# Patient Record
Sex: Female | Born: 1995 | Race: Black or African American | Hispanic: No | Marital: Single | Smoking: Current every day smoker
Health system: Southern US, Community
[De-identification: ages and names within clinical notes are randomized; demographics above are authoritative.]

## PROBLEM LIST (undated history)

## (undated) DIAGNOSIS — N83209 Unspecified ovarian cyst, unspecified side: Secondary | ICD-10-CM

## (undated) DIAGNOSIS — N739 Female pelvic inflammatory disease, unspecified: Secondary | ICD-10-CM

## (undated) HISTORY — PX: LEFT OOPHORECTOMY: SHX1961

## (undated) HISTORY — PX: OVARIAN CYST REMOVAL: SHX89

---
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
## 2017-06-10 ENCOUNTER — Emergency Department: Payer: Medicaid Other

## 2017-06-10 ENCOUNTER — Emergency Department
Admission: EM | Admit: 2017-06-10 | Discharge: 2017-06-10 | Disposition: A | Discharge status: Home / Self Care | Payer: Medicaid Other | Attending: Emergency Medicine | Admitting: Emergency Medicine

## 2017-06-10 DIAGNOSIS — Z3046 Encounter for surveillance of implantable subdermal contraceptive: Secondary | ICD-10-CM | POA: Diagnosis not present

## 2017-06-10 DIAGNOSIS — F17210 Nicotine dependence, cigarettes, uncomplicated: Secondary | ICD-10-CM | POA: Insufficient documentation

## 2017-06-10 DIAGNOSIS — R1010 Upper abdominal pain, unspecified: Secondary | ICD-10-CM | POA: Diagnosis present

## 2017-06-10 DIAGNOSIS — N83201 Unspecified ovarian cyst, right side: Secondary | ICD-10-CM | POA: Diagnosis not present

## 2017-06-10 DIAGNOSIS — R110 Nausea: Secondary | ICD-10-CM | POA: Diagnosis not present

## 2017-06-10 LAB — URINALYSIS, COMPLETE (UACMP) WITH MICROSCOPIC
BACTERIA UA: NONE SEEN
BILIRUBIN URINE: NEGATIVE
GLUCOSE, UA: NEGATIVE mg/dL
HGB URINE DIPSTICK: NEGATIVE
Ketones, ur: NEGATIVE mg/dL
NITRITE: NEGATIVE
PROTEIN: NEGATIVE mg/dL
Specific Gravity, Urine: 1.023 (ref 1.005–1.030)
pH: 6 (ref 5.0–8.0)

## 2017-06-10 LAB — COMPREHENSIVE METABOLIC PANEL
ALK PHOS: 90 U/L (ref 38–126)
ALT: 11 U/L — ABNORMAL LOW (ref 14–54)
ANION GAP: 7 (ref 5–15)
AST: 20 U/L (ref 15–41)
Albumin: 3.9 g/dL (ref 3.5–5.0)
BUN: 7 mg/dL (ref 6–20)
CO2: 29 mmol/L (ref 22–32)
Calcium: 9.5 mg/dL (ref 8.9–10.3)
Chloride: 104 mmol/L (ref 101–111)
Creatinine, Ser: 0.62 mg/dL (ref 0.44–1.00)
Glucose, Bld: 109 mg/dL — ABNORMAL HIGH (ref 65–99)
POTASSIUM: 4.8 mmol/L (ref 3.5–5.1)
Sodium: 140 mmol/L (ref 135–145)
TOTAL PROTEIN: 8.3 g/dL — AB (ref 6.5–8.1)
Total Bilirubin: 0.9 mg/dL (ref 0.3–1.2)

## 2017-06-10 LAB — POCT PREGNANCY, URINE: PREG TEST UR: NEGATIVE

## 2017-06-10 LAB — CBC
HEMATOCRIT: 40.5 % (ref 35.0–47.0)
HEMOGLOBIN: 13.9 g/dL (ref 12.0–16.0)
MCH: 34 pg (ref 26.0–34.0)
MCHC: 34.4 g/dL (ref 32.0–36.0)
MCV: 99 fL (ref 80.0–100.0)
Platelets: 292 10*3/uL (ref 150–440)
RBC: 4.09 MIL/uL (ref 3.80–5.20)
RDW: 14.1 % (ref 11.5–14.5)
WBC: 8 10*3/uL (ref 3.6–11.0)

## 2017-06-10 LAB — CHLAMYDIA/NGC RT PCR (ARMC ONLY)
CHLAMYDIA TR: DETECTED — AB
N gonorrhoeae: NOT DETECTED

## 2017-06-10 LAB — LIPASE, BLOOD: Lipase: 17 U/L (ref 11–51)

## 2017-06-10 MED ORDER — IOPAMIDOL (ISOVUE-300) INJECTION 61%
75.0000 mL | Freq: Once | INTRAVENOUS | Status: AC | PRN
  Administered 2017-06-10: 75 mL via INTRAVENOUS
  Filled 2017-06-10: qty 75

## 2017-06-10 MED ORDER — KETOROLAC TROMETHAMINE 30 MG/ML IJ SOLN
15.0000 mg | Freq: Once | INTRAMUSCULAR | Status: AC
  Administered 2017-06-10: 15 mg via INTRAVENOUS
  Filled 2017-06-10: qty 1

## 2017-06-10 MED ORDER — OXYCODONE-ACETAMINOPHEN 5-325 MG PO TABS: 1.0000 | ORAL_TABLET | ORAL | 0 refills | Status: DC | PRN

## 2017-06-10 NOTE — ED Triage Notes (Signed)
Mid abdominal pain X 2 days. NVD. Intermittent sharp pain. Pt alert and oriented X4, active, cooperative, pt in NAD. RR even and unlabored, color WNL.

## 2017-06-10 NOTE — ED Notes (Signed)
Patient transported to Ultrasound 

## 2017-06-10 NOTE — Discharge Instructions (Signed)
Please make an appointment to follow-up with your OB gynecologist Dr. Georgianne Fick THIS WEEK for a recheck.  Return to the emergency department immediately for any new or worsening symptoms such as worsening pain, if she cannot eat or drink, or for any other concerns whatsoever.  It was a pleasure to take care of you today, and thank you for coming to our emergency department.  If you have any questions or concerns before leaving please ask the nurse to grab me and I'm more than happy to go through your aftercare instructions again.  If you were prescribed any opioid pain medication today such as Norco, Vicodin, Percocet, morphine, hydrocodone, or oxycodone please make sure you do not drive when you are taking this medication as it can alter your ability to drive safely.  If you have any concerns once you are home that you are not improving or are in fact getting worse before you can make it to your follow-up appointment, please do not hesitate to call [phone] and come back for further evaluation.  Darel Hong, MD  Results for orders placed or performed during the hospital encounter of 06/10/17  Lipase, blood  Result Value Ref Range   Lipase 17 11 - 51 U/L  Comprehensive metabolic panel  Result Value Ref Range   Sodium 140 135 - 145 mmol/L   Potassium 4.8 3.5 - 5.1 mmol/L   Chloride 104 101 - 111 mmol/L   CO2 29 22 - 32 mmol/L   Glucose, Bld 109 (H) 65 - 99 mg/dL   BUN 7 6 - 20 mg/dL   Creatinine, Ser 0.62 0.44 - 1.00 mg/dL   Calcium 9.5 8.9 - 10.3 mg/dL   Total Protein 8.3 (H) 6.5 - 8.1 g/dL   Albumin 3.9 3.5 - 5.0 g/dL   AST 20 15 - 41 U/L   ALT 11 (L) 14 - 54 U/L   Alkaline Phosphatase 90 38 - 126 U/L   Total Bilirubin 0.9 0.3 - 1.2 mg/dL   GFR calc non Af Amer >60 >60 mL/min   GFR calc Af Amer >60 >60 mL/min   Anion gap 7 5 - 15  CBC  Result Value Ref Range   WBC 8.0 3.6 - 11.0 K/uL   RBC 4.09 3.80 - 5.20 MIL/uL   Hemoglobin 13.9 12.0 - 16.0 g/dL   HCT 40.5 35.0 - 47.0 %   MCV  99.0 80.0 - 100.0 fL   MCH 34.0 26.0 - 34.0 pg   MCHC 34.4 32.0 - 36.0 g/dL   RDW 14.1 11.5 - 14.5 %   Platelets 292 150 - 440 K/uL  Urinalysis, Complete w Microscopic  Result Value Ref Range   Color, Urine YELLOW (A) YELLOW   APPearance CLEAR (A) CLEAR   Specific Gravity, Urine 1.023 1.005 - 1.030   pH 6.0 5.0 - 8.0   Glucose, UA NEGATIVE NEGATIVE mg/dL   Hgb urine dipstick NEGATIVE NEGATIVE   Bilirubin Urine NEGATIVE NEGATIVE   Ketones, ur NEGATIVE NEGATIVE mg/dL   Protein, ur NEGATIVE NEGATIVE mg/dL   Nitrite NEGATIVE NEGATIVE   Leukocytes, UA TRACE (A) NEGATIVE   RBC / HPF 0-5 0 - 5 RBC/hpf   WBC, UA 6-30 0 - 5 WBC/hpf   Bacteria, UA NONE SEEN NONE SEEN   Squamous Epithelial / LPF 6-30 (A) NONE SEEN   Mucus PRESENT   Pregnancy, urine POC  Result Value Ref Range   Preg Test, Ur NEGATIVE NEGATIVE   US Transvaginal Non-ob  Result Date: 06/10/2017  CLINICAL DATA:  Pelvic pain for 3 days. Previous history of left oophorectomy. EXAM: TRANSABDOMINAL AND TRANSVAGINAL ULTRASOUND OF PELVIS DOPPLER ULTRASOUND OF OVARIES TECHNIQUE: Both transabdominal and transvaginal ultrasound examinations of the pelvis were performed. Transabdominal technique was performed for global imaging of the pelvis including uterus, ovaries, adnexal regions, and pelvic cul-de-sac. It was necessary to proceed with endovaginal exam following the transabdominal exam to visualize the endometrium and right ovary. Color and duplex Doppler ultrasound was utilized to evaluate blood flow to the ovary. COMPARISON:  CT abdomen and pelvis 06/10/2017 and 02/09/2016. Ultrasound pelvis 02/09/2016. FINDINGS: Uterus Measurements: 7.8 x 3.9 x 5.1 cm. No fibroids or other mass visualized. Endometrium Thickness: 7 mm.  No focal abnormality visualized. Right ovary Measurements: 6 x 3.7 x 4. The uterus is mildly enlarged. There is a complex cyst with multiple septations and internal debris within the ovary. Correlating with CT 06/10/2017,  this likely represents a hemorrhagic cyst. Left ovary Left ovary surgically absent. No mass is demonstrated in the left adnexal region. Pulsed Doppler evaluation of the right ovary demonstrates normal low-resistance arterial and venous waveforms. Other findings Small amount of free fluid in the pelvis. IMPRESSION: The right ovary is enlarged and heterogeneous with complex cystic appearance. Changes are likely to represent a hemorrhagic cyst. Normal Doppler flow waveforms are obtained but mostly in the periphery of the ovary. Can't entirely exclude residua from previous intermittent torsion. Recommend follow-up ultrasound in 6-8 weeks or earlier if clinical symptoms warrant. These results were called by telephone at the time of interpretation on 06/10/2017 at 10:24 pm to Dr. Darel Hong , who verbally acknowledged these results. Electronically Signed   By: Lucienne Capers M.D.   On: 06/10/2017 22:55   US Pelvis Complete  Result Date: 06/10/2017 CLINICAL DATA:  Pelvic pain for 3 days. Previous history of left oophorectomy. EXAM: TRANSABDOMINAL AND TRANSVAGINAL ULTRASOUND OF PELVIS DOPPLER ULTRASOUND OF OVARIES TECHNIQUE: Both transabdominal and transvaginal ultrasound examinations of the pelvis were performed. Transabdominal technique was performed for global imaging of the pelvis including uterus, ovaries, adnexal regions, and pelvic cul-de-sac. It was necessary to proceed with endovaginal exam following the transabdominal exam to visualize the endometrium and right ovary. Color and duplex Doppler ultrasound was utilized to evaluate blood flow to the ovary. COMPARISON:  CT abdomen and pelvis 06/10/2017 and 02/09/2016. Ultrasound pelvis 02/09/2016. FINDINGS: Uterus Measurements: 7.8 x 3.9 x 5.1 cm. No fibroids or other mass visualized. Endometrium Thickness: 7 mm.  No focal abnormality visualized. Right ovary Measurements: 6 x 3.7 x 4. The uterus is mildly enlarged. There is a complex cyst with multiple  septations and internal debris within the ovary. Correlating with CT 06/10/2017, this likely represents a hemorrhagic cyst. Left ovary Left ovary surgically absent. No mass is demonstrated in the left adnexal region. Pulsed Doppler evaluation of the right ovary demonstrates normal low-resistance arterial and venous waveforms. Other findings Small amount of free fluid in the pelvis. IMPRESSION: The right ovary is enlarged and heterogeneous with complex cystic appearance. Changes are likely to represent a hemorrhagic cyst. Normal Doppler flow waveforms are obtained but mostly in the periphery of the ovary. Can't entirely exclude residua from previous intermittent torsion. Recommend follow-up ultrasound in 6-8 weeks or earlier if clinical symptoms warrant. These results were called by telephone at the time of interpretation on 06/10/2017 at 10:24 pm to Dr. Darel Hong , who verbally acknowledged these results. Electronically Signed   By: Lucienne Capers M.D.   On: 06/10/2017 22:55   Ct  Abdomen Pelvis W Contrast  Result Date: 06/10/2017 CLINICAL DATA:  Mid abdominal pain for 2 days. EXAM: CT ABDOMEN AND PELVIS WITH CONTRAST TECHNIQUE: Multidetector CT imaging of the abdomen and pelvis was performed using the standard protocol following bolus administration of intravenous contrast. CONTRAST:  74mL ISOVUE-300 IOPAMIDOL (ISOVUE-300) INJECTION 61% COMPARISON:  02/09/2016 FINDINGS: Lower chest:  Unremarkable Hepatobiliary: No focal abnormality within the liver parenchyma. There is no evidence for gallstones, gallbladder wall thickening, or pericholecystic fluid. No intrahepatic or extrahepatic biliary dilation. Pancreas: No focal mass lesion. No dilatation of the main duct. No intraparenchymal cyst. No peripancreatic edema. Spleen: No splenomegaly. No focal mass lesion. Adrenals/Urinary Tract: No adrenal nodule or mass. Kidneys are unremarkable. No evidence for hydroureter. The urinary bladder appears normal for the  degree of distention. Stomach/Bowel: Stomach is nondistended. No gastric wall thickening. No evidence of outlet obstruction. Duodenum is normally positioned as is the ligament of Treitz. No small bowel wall thickening. No small bowel dilatation. The terminal ileum is normal. The appendix is not visualized, but there is no edema or inflammation in the region of the cecum. No gross colonic mass. There may be some pericolonic edema/ inflammation in the region of the splenic flexure, but this region of the colon is not well distended. Vascular/Lymphatic: No abdominal aortic aneurysm. No abdominal aortic atherosclerotic calcification. There is no gastrohepatic or hepatoduodenal ligament lymphadenopathy. No intraperitoneal or retroperitoneal lymphadenopathy. No pelvic sidewall lymphadenopathy. Reproductive: Uterus unremarkable. No left adnexal mass. 3.6 x 6.1 x 4.1 cm complex cystic mass identified right adnexal space. Serpiginous, a tubular fluid-filled structure running along the medial aspect of this cystic lesion may be dilated fallopian tube, but lack of oral contrast on today's study hinders assessment and this may simply represent small bowel. Other: Small volume intraperitoneal free fluid identified in the cul-de-sac. Musculoskeletal: Bone windows reveal no worrisome lytic or sclerotic osseous lesions. IMPRESSION: 1. 4 x 6 x 4 cm complex cystic lesion right adnexal space with small volume free fluid in the posterior right adnexal region and cul-de-sac. This could be a complex ovarian cyst although ovarian torsion and TOA cannot be excluded. Serpiginous tubular structure running along the medial aspect of the lesion could represent a dilated fallopian tube, but given that no oral contrast was administered for this study, unopacified small bowel could also produce this appearance. Pelvic ultrasound may prove helpful to further evaluate. 2. Question mild pericolonic edema/inflammation around the splenic flexure, but  this area of colon is not well distended . Electronically Signed   By: Misty Stanley M.D.   On: 06/10/2017 21:01   [place] Art/ven Flow Abd Pelv Doppler  Result Date: 06/10/2017 CLINICAL DATA:  Pelvic pain for 3 days. Previous history of left oophorectomy. EXAM: TRANSABDOMINAL AND TRANSVAGINAL ULTRASOUND OF PELVIS DOPPLER ULTRASOUND OF OVARIES TECHNIQUE: Both transabdominal and transvaginal ultrasound examinations of the pelvis were performed. Transabdominal technique was performed for global imaging of the pelvis including uterus, ovaries, adnexal regions, and pelvic cul-de-sac. It was necessary to proceed with endovaginal exam following the transabdominal exam to visualize the endometrium and right ovary. Color and duplex Doppler ultrasound was utilized to evaluate blood flow to the ovary. COMPARISON:  CT abdomen and pelvis 06/10/2017 and 02/09/2016. Ultrasound pelvis 02/09/2016. FINDINGS: Uterus Measurements: 7.8 x 3.9 x 5.1 cm. No fibroids or other mass visualized. Endometrium Thickness: 7 mm.  No focal abnormality visualized. Right ovary Measurements: 6 x 3.7 x 4. The uterus is mildly enlarged. There is a complex cyst with multiple septations and  internal debris within the ovary. Correlating with CT 06/10/2017, this likely represents a hemorrhagic cyst. Left ovary Left ovary surgically absent. No mass is demonstrated in the left adnexal region. Pulsed Doppler evaluation of the right ovary demonstrates normal low-resistance arterial and venous waveforms. Other findings Small amount of free fluid in the pelvis. IMPRESSION: The right ovary is enlarged and heterogeneous with complex cystic appearance. Changes are likely to represent a hemorrhagic cyst. Normal Doppler flow waveforms are obtained but mostly in the periphery of the ovary. Can't entirely exclude residua from previous intermittent torsion. Recommend follow-up ultrasound in 6-8 weeks or earlier if clinical symptoms warrant. These results were called by  telephone at the time of interpretation on 06/10/2017 at 10:24 pm to Dr. Darel Hong , who verbally acknowledged these results. Electronically Signed   By: Lucienne Capers M.D.   On: 06/10/2017 22:55

## 2017-06-10 NOTE — ED Provider Notes (Signed)
[facility] Emergency Department Provider Note  ____________________________________________   First MD Initiated Contact with Patient 06/10/17 2011     (approximate)  I have reviewed the triage vital signs and the nursing notes.   HISTORY  Chief Complaint Abdominal Pain and Nausea    HPI Jamie Ryan is a 21 y.o. female who comes to the emergency department with 2 days of abdominal pain. Pain is mostly in her upper abdomen is intermittent lasting about a minute at a time and recurring every other minute or so. It is associated with nausea but no vomiting. No fevers or chills. No dysuria frequency or hesitancy. She has an abdominal surgical history of a left-sided oophorectomy for dermoid cyst.her pain is intermittent severe nothing seems to make it better or worse.   [phone]  Past Medical History:  Diagnosis Date  . Ovarian cyst   . PID (pelvic inflammatory disease) 01-2016    There are no active problems to display for this patient.   Past Surgical History:  Procedure Laterality Date  . LAPAROSCOPIC OVARIAN CYSTECTOMY Left 02/14/2016   Procedure: LAPAROSCOPY WITH Left Oophorectomy;  Surgeon: Malachy Mood, MD;  Location: [facility] ORS;  Service: Gynecology;  Laterality: Left;  . OVARIAN CYST REMOVAL      Prior to Admission medications   Medication Sig Start Date End Date Taking? Authorizing Provider  etonogestrel (IMPLANON) 68 MG IMPL implant 1 each by Subdermal route once.    [provider]  ibuprofen (ADVIL,MOTRIN) 600 MG tablet Take 1 tablet (600 mg total) by mouth every 6 (six) hours as needed. Patient not taking: Reported on 02/14/2016 02/09/16   Malachy Mood, MD  oxyCODONE-acetaminophen (PERCOCET/ROXICET) 5-325 MG tablet Take 1 tablet by mouth every 4 (four) hours as needed for moderate pain or severe pain. 06/10/17   Darel Hong, MD    Allergies Patient has no known allergies.  No family history on  file.  Social History Social History  Substance Use Topics  . Smoking status: Current Every Day Smoker    Packs/day: 0.25    Years: 1.00    Types: Cigarettes  . Smokeless tobacco: Not on file  . Alcohol use No    Review of Systems Constitutional: No fever/chills Eyes: No visual changes. ENT: No sore throat. Cardiovascular: Denies chest pain. Respiratory: Denies shortness of breath. Gastrointestinal: positive abdominal pain.  Positive nausea, no vomiting.  No diarrhea.  No constipation. Genitourinary: Negative for dysuria. Musculoskeletal: Negative for back pain. Skin: Negative for rash. Neurological: Negative for headaches, focal weakness or numbness.   ____________________________________________   PHYSICAL EXAM:  VITAL SIGNS: ED Triage Vitals  Enc Vitals Group     BP 06/10/17 1849 120/72     Pulse Rate 06/10/17 1849 (!) 57     Resp 06/10/17 1849 18     Temp 06/10/17 1849 98.1 F (36.7 C)     Temp Source 06/10/17 1849 Oral     SpO2 06/10/17 1849 100 %     Weight 06/10/17 1850 124 lb (56.2 kg)     Height 06/10/17 1850 5' (1.524 m)     Head Circumference --      Peak Flow --      Pain Score 06/10/17 1849 7     Pain Loc --      Pain Edu? --      Excl. in [place]? --     Constitutional: alert and oriented 4 appears somewhat uncomfortable nontoxic no diaphoresis speaks in full clear sentences Eyes:  PERRL EOMI. Head: Atraumatic. Nose: No congestion/rhinnorhea. Mouth/Throat: No trismus Neck: No stridor.   Cardiovascular: Normal rate, regular rhythm. Grossly normal heart sounds.  Good peripheral circulation. Respiratory: Normal respiratory effort.  No retractions. Lungs CTAB and moving good air Gastrointestinal: soft nondistended she is tender in her left greater than right lower quadrants negative Rovsing's no frank peritonitis negative Murphy's Musculoskeletal: No lower extremity edema   Neurologic:  Normal speech and language. No gross focal neurologic deficits  are appreciated. Skin:  Skin is warm, dry and intact. No rash noted. Psychiatric: Mood and affect are normal. Speech and behavior are normal.    ____________________________________________   DIFFERENTIAL includes but not limited to  appendicitis, diverticulitis, ovarian torsion, pyelonephritis, renal colic ____________________________________________   LABS (all labs ordered are listed, but only abnormal results are displayed)  Labs Reviewed  COMPREHENSIVE METABOLIC PANEL - Abnormal; Notable for the following:       Result Value   Glucose, Bld 109 (*)    Total Protein 8.3 (*)    ALT 11 (*)    All other components within normal limits  URINALYSIS, COMPLETE (UACMP) WITH MICROSCOPIC - Abnormal; Notable for the following:    Color, Urine YELLOW (*)    APPearance CLEAR (*)    Leukocytes, UA TRACE (*)    Squamous Epithelial / LPF 6-30 (*)    All other components within normal limits  CHLAMYDIA/NGC RT PCR ([facility] ONLY)  LIPASE, BLOOD  CBC  POC URINE PREG, ED  POCT PREGNANCY, URINE    not pregnant blood work unremarkable __________________________________________  EKG   ____________________________________________  RADIOLOGY  CT scan is complex but does show a 4 x 6 x 4 cm complex cyst in her right adnexal ____________________________________________   PROCEDURES  Procedure(s) performed: no  Procedures  Critical Care performed: no  Observation: no ____________________________________________   INITIAL IMPRESSION / ASSESSMENT AND PLAN / ED COURSE  Pertinent labs & imaging results that were available during my care of the patient were reviewed by me and considered in my medical decision making (see chart for details).  The patient arrives well-appearing and hemodynamically stable. She reports upper abdominal pain although she is quite tender in her left greater than right lower quadrants.     ----------------------------------------- 9:15 PM on  06/10/2017 -----------------------------------------  The patient's CT scan came back with a complex cystic mass in her right pelvis. Ovarian torsion is in the differential/ordered a pelvic ultrasound, however as the patient only has one ovary and desires children I have consulted [facility] (Dr. Georgianne Fick is her OB). ____________________________________________  ----------------------------------------- 9:19 PM on 06/10/2017 -----------------------------------------  I discussed the case with Dr. Kenton Kingfisher of [facility] gynecology who agrees with pelvic ultrasound. He said that as her pain is adequately controlled at this time she most likely is not currently torsed. Once we have the results of the ultrasound he is requesting a call back.  ----------------------------------------- 11:04 PM on 06/10/2017 -----------------------------------------  The patient's pelvic ultrasound is complex but appears to be most likely a hemorrhagic cyst. I discussed the case with Dr. Kenton Kingfisher who reviewed the films and as the patient is currently asymptomatic and her history is not entirely consistent with ovarian torsion we both feel the patient is medically stable for outpatient management with Dr. Georgianne Fick this week.  FINAL CLINICAL IMPRESSION(S) / ED DIAGNOSES  Final diagnoses:  Cyst of right ovary      NEW MEDICATIONS STARTED DURING THIS VISIT:  Current Discharge Medication List  Note:  This document was prepared using Dragon voice recognition software and may include unintentional dictation errors.     Darel Hong, MD 06/10/17 [phone]

## 2017-06-11 ENCOUNTER — Telehealth: Payer: Self-pay | Admitting: Emergency Medicine

## 2017-06-11 NOTE — Telephone Encounter (Addendum)
Called to give patient results of std testing.  Positive chlamydia test, and was not treated for in the ED.  Per Dr. Jimmye Norman can call in azithromycin 1 gram for patient.  Her telephone is not taking calls at this time.  Will call gyn followup doctor.  Called [facility].  They gave me another number to try to reach patient [phone].  Left message on that number.  Will send letter.  [facility] will get patient treated if she calls for appt. Before I get in touch with her.

## 2017-06-17 ENCOUNTER — Ambulatory Visit (INDEPENDENT_AMBULATORY_CARE_PROVIDER_SITE_OTHER): Payer: Medicaid Other | Admitting: Obstetrics and Gynecology

## 2017-06-17 ENCOUNTER — Encounter: Payer: Self-pay | Admitting: Obstetrics and Gynecology

## 2017-06-17 VITALS — BP 100/60 | HR 60 | Ht 60.0 in | Wt 131.0 lb

## 2017-06-17 DIAGNOSIS — N83201 Unspecified ovarian cyst, right side: Secondary | ICD-10-CM | POA: Diagnosis not present

## 2017-06-17 DIAGNOSIS — N7093 Salpingitis and oophoritis, unspecified: Secondary | ICD-10-CM

## 2017-06-17 DIAGNOSIS — R102 Pelvic and perineal pain: Secondary | ICD-10-CM | POA: Diagnosis not present

## 2017-06-17 DIAGNOSIS — N730 Acute parametritis and pelvic cellulitis: Secondary | ICD-10-CM

## 2017-06-17 MED ORDER — OXYCODONE-ACETAMINOPHEN 5-325 MG PO TABS: 1.0000 | ORAL_TABLET | Freq: Four times a day (QID) | ORAL | 0 refills | Status: DC | PRN

## 2017-06-17 MED ORDER — CEFTRIAXONE SODIUM 250 MG IJ SOLR
250.0000 mg | Freq: Once | INTRAMUSCULAR | Status: AC
  Administered 2017-06-17: 250 mg via INTRAMUSCULAR

## 2017-06-17 MED ORDER — DOXYCYCLINE HYCLATE 100 MG PO CAPS: 100.0000 mg | ORAL_CAPSULE | Freq: Two times a day (BID) | ORAL | 0 refills | Status: AC

## 2017-06-17 NOTE — Patient Instructions (Signed)
Ovarian Cyst °An ovarian cyst is a fluid-filled sac on an ovary. The ovaries are organs that make eggs in women. Most ovarian cysts go away on their own and are not cancerous (are benign). Some cysts need treatment. °Follow these instructions at home: °· Take over-the-counter and prescription medicines only as told by your doctor. °· Do not drive or use heavy machinery while taking prescription pain medicine. °· Get pelvic exams and Pap tests as often as told by your doctor. °· Return to your normal activities as told by your doctor. Ask your doctor what activities are safe for you. °· Do not use any products that contain nicotine or tobacco, such as cigarettes and e-cigarettes. If you need help quitting, ask your doctor. °· Keep all follow-up visits as told by your doctor. This is important. °Contact a doctor if: °· Your periods are: °¨ Late. °¨ Irregular. °¨ Painful. °· Your periods stop. °· You have pelvic pain that does not go away. °· You have pressure on your bladder. °· You have trouble making your bladder empty when you pee (urinate). °· You have pain during sex. °· You have any of the following in your belly (abdomen): °¨ A feeling of fullness. °¨ Pressure. °¨ Discomfort. °¨ Pain that does not go away. °¨ Swelling. °· You feel sick most of the time. °· You have trouble pooping (have constipation). °· You are not as hungry as usual (you lose your appetite). °· You get very bad acne. °· You start to have more hair on your body and face. °· You are gaining weight or losing weight without changing your exercise and eating habits. °· You think you may be pregnant. °Get help right away if: °· You have belly pain that is very bad or gets worse. °· You cannot eat or drink without throwing up (vomiting). °· You suddenly get a fever. °· Your period is a lot heavier than usual. °This information is not intended to replace advice given to you by your health care provider. Make sure you discuss any questions you have  with your health care provider. °Document Released: 03/05/2008 Document Revised: 04/06/2016 Document Reviewed: 02/19/2016 °Elsevier Interactive Patient Education © 2017 [employer] ° °

## 2017-06-17 NOTE — Progress Notes (Signed)
Obstetrics & Gynecology Office Visit   Chief Complaint:  Chief Complaint  Patient presents with  . Follow-up    stomach hurt/cramping; bleeding for 2nd time this month; no bc;     History of Present Illness: 21 year old GO with a history of left salpingo-oophorectomy for dermoid cyst seen in the emergency department for acute right lower quadrant pain.  Normal white count at that time with imaging showing a complex right adnexal process with a tubular component noted.  The patient was discharged to outpatient follow up.  She did not have a white count, was afebrile at the time of evaluation.  Symptoms have persisted, pain 5/10 confined to the right lower quadrant.  She has not noted an increased in vaginal discharge, has noted some irregular vaginal bleeding with repeat menses 2 weeks after last.  Pregnancy test in ED was negative.  She is not currently on any hormonal contraception, is in a monogamous relationship with one partner.   She did not have a wet mount done at the time of her visit.  Gynecology was consulted and she was dispositioned to outpatient follow up by Dr. Kenton Kingfisher.    Following discharge the patient's chlamydia did return positive, I have not been able to contact her  After the lab was routed to me.  Given the tubular structure and now positive chlamydia this likely represents to sequela of PID and TOA.  The patient is relatively asymptomatic on exam, non-toxic appearing, and has not had any nausea or emesis.     Review of Systems: 10 point review of systems negative unless otherwise noted in HPI  Past Medical History:  Past Medical History:  Diagnosis Date  . Ovarian cyst   . PID (pelvic inflammatory disease) 01-2016    Past Surgical History:  Past Surgical History:  Procedure Laterality Date  . LAPAROSCOPIC OVARIAN CYSTECTOMY Left 02/14/2016   Procedure: LAPAROSCOPY WITH Left Oophorectomy;  Surgeon: Malachy Mood, MD;  Location: [facility] ORS;  Service: Gynecology;   Laterality: Left;  . OVARIAN CYST REMOVAL      Gynecologic History: Patient's last menstrual period was 06/16/2017.  Obstetric History: G0P0000  Family History:  Family History  Problem Relation Age of Onset  . Diabetes Paternal Grandfather     Social History:  Social History   Social History  . Marital status: Single    Spouse name: N/A  . Number of children: N/A  . Years of education: N/A   Occupational History  . Not on file.   Social History Main Topics  . Smoking status: Current Every Day Smoker    Packs/day: 0.25    Years: 1.00    Types: Cigarettes  . Smokeless tobacco: Never Used  . Alcohol use No  . Drug use: No  . Sexual activity: Yes    Birth control/ protection: None   Other Topics Concern  . Not on file   Social History Narrative  . No narrative on file    Allergies:  No Known Allergies  Medications: Prior to Admission medications   Medication Sig Start Date End Date Taking? Authorizing Provider  oxyCODONE-acetaminophen (PERCOCET/ROXICET) 5-325 MG tablet Take 1 tablet by mouth every 4 (four) hours as needed for moderate pain or severe pain. 06/10/17  Yes Darel Hong, MD    Physical Exam Vitals:  Vitals:   06/17/17 0810  BP: 100/60  Pulse: 60   Patient's last menstrual period was 06/16/2017.  General: NAD HEENT: normocephalic, anicteric Thyroid: no enlargement,  no palpable nodules Pulmonary: No increased work of breathing Cardiovascular: RRR, distal pulses 2+ Abdomen: NABS, soft, non-tender, non-distended.  Umbilicus without lesions.  No hepatomegaly, splenomegaly or masses palpable. No evidence of hernia  Genitourinary:  External: Normal external female genitalia.  Normal urethral meatus, normal  Bartholin's and Skene's glands.    Vagina: Normal vaginal mucosa, no evidence of prolapse.    Cervix: Grossly normal in appearance, no bleeding  Uterus: Non-enlarged, mobile, normal contour.  No CMT  Adnexa: ovaries non-enlarged, no  adnexal masses  Rectal: deferred  Lymphatic: no evidence of inguinal lymphadenopathy Extremities: no edema, erythema, or tenderness Neurologic: Grossly intact Psychiatric: mood appropriate, affect full  Female chaperone present for pelvic and breast  portions of the physical exam  Assessment: 21 y.o. G0P0000 with PID, TOA  Plan: Problem List Items Addressed This Visit    None    Visit Diagnoses    Right ovarian cyst    -  Primary   Relevant Orders   US Transvaginal Non-OB   Pelvic pain       Relevant Orders   US Transvaginal Non-OB   PID (acute pelvic inflammatory disease)       Relevant Medications   cefTRIAXone (ROCEPHIN) injection 250 mg (Completed)   Tubo-ovarian abscess         - Given tubular compenent will treat as possible PID with ceftriaxone and 2 weeks course doxycyline - We discussed if fails to improve may require inpatient admission and IV antibiotics, in addition we discussed fertility implications with PID particularly in the setting of only on function fallopian tube - follow up US in 6 weeks - A total of 15 minutes were spent in face-to-face contact with the patient during this encounter with over half of that time devoted to counseling and coordination of care.

## 2017-06-20 ENCOUNTER — Telehealth: Payer: Self-pay | Admitting: Obstetrics and Gynecology

## 2017-06-20 NOTE — Telephone Encounter (Signed)
Called and left message with Patient family to have patient call back to schedule appt per AMS for US pelvis transvaginal non-ob and follow up with AMS by 07/29/17. Notifiy Izora Gala if paitent decides to not schedule appt. So we may cancel order to u/s.

## 2017-06-24 ENCOUNTER — Other Ambulatory Visit: Payer: Self-pay | Admitting: Obstetrics and Gynecology

## 2017-06-24 ENCOUNTER — Telehealth: Payer: Self-pay

## 2017-06-24 MED ORDER — ONDANSETRON 4 MG PO TBDP: 4.0000 mg | ORAL_TABLET | Freq: Four times a day (QID) | ORAL | 0 refills | Status: DC | PRN

## 2017-06-24 NOTE — Telephone Encounter (Signed)
Pt has question about med AMS rx'd   She took one pill and threw it up Saturday pm and did not take the second one - no antibx taken since.  What to do?  [phone]

## 2017-06-24 NOTE — Telephone Encounter (Signed)
Called in an antiemetic (zofran) to her pharmacy that she can take 30-60 minutes prior to the antibiotic.  If she continues to be intolerant of taking the medication by mouth she may need to go to the ER to be admitted for IV antibiotics

## 2017-06-24 NOTE — Telephone Encounter (Signed)
Please advise 

## 2017-06-25 NOTE — Telephone Encounter (Signed)
LVM for patient to call me back 

## 2017-06-25 NOTE — Telephone Encounter (Signed)
Pt aware of medication being called in 

## 2017-06-25 NOTE — Telephone Encounter (Signed)
Pt is calling back - please advise.  

## 2017-07-30 ENCOUNTER — Ambulatory Visit: Payer: Medicaid Other | Admitting: Obstetrics and Gynecology

## 2017-07-30 ENCOUNTER — Other Ambulatory Visit: Payer: Medicaid Other

## 2018-12-17 ENCOUNTER — Ambulatory Visit: Payer: Medicaid Other | Admitting: Obstetrics & Gynecology

## 2019-02-09 ENCOUNTER — Ambulatory Visit: Payer: Medicaid Other | Admitting: Maternal Newborn

## 2019-02-13 IMAGING — CT CT ABD-PELV W/ CM
2 of 4 series · 15 of 46 positions shown, 17 images · IV contrast (iopamidol)
Comparison: 02/09/2016

CLINICAL DATA: Mid abdominal pain for 2 days.

EXAM:
CT ABDOMEN AND PELVIS WITH CONTRAST
TECHNIQUE: Multidetector CT imaging of the abdomen and pelvis was performed
using the standard protocol following bolus administration of
intravenous contrast.
CONTRAST:  75mL OHFVHD-OLL IOPAMIDOL (OHFVHD-OLL) INJECTION 61%

[Series 2: routine abd/pel with · axial · 0.60mm/px · z∈[-416,-86]mm · 12 of 74 slices shown, 14 images]
[im 4/74  soft-tissue]
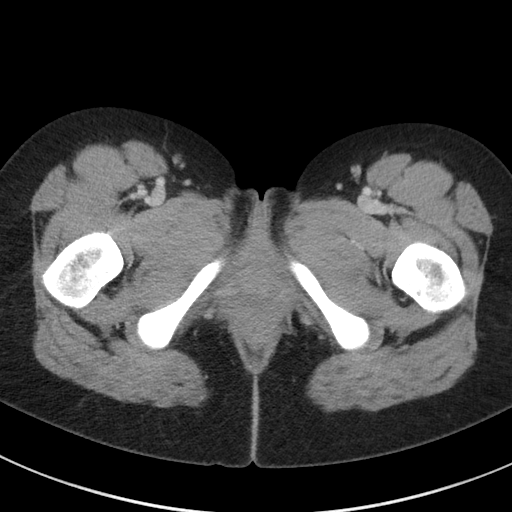
[im 4/74  bone]
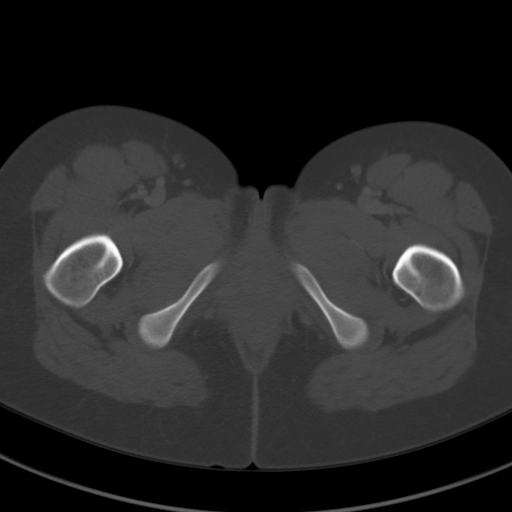
[im 10/74  soft-tissue]
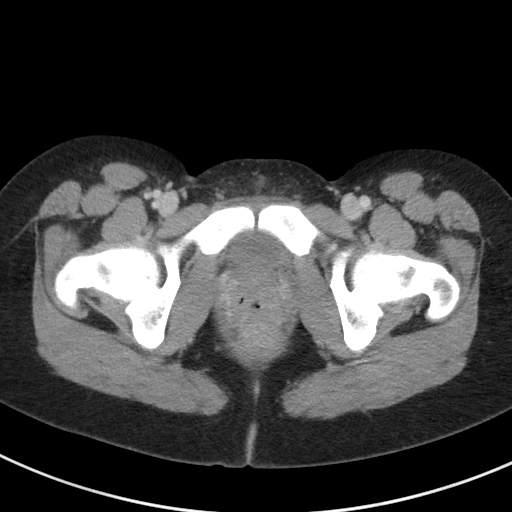
[im 16/74  soft-tissue]
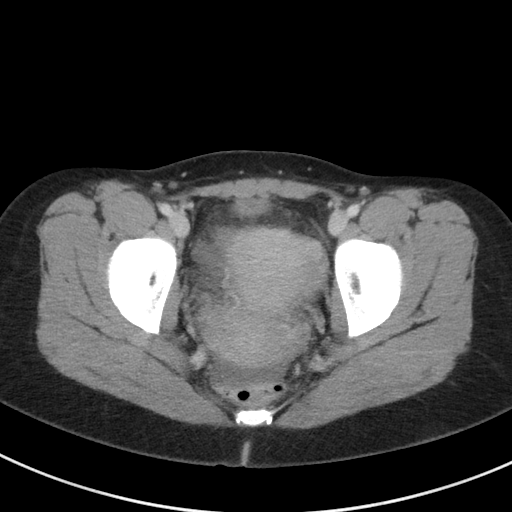
[im 22/74  soft-tissue]
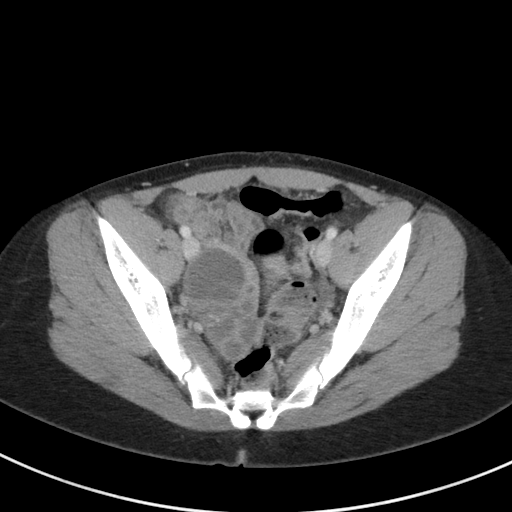
[im 28/74  soft-tissue]
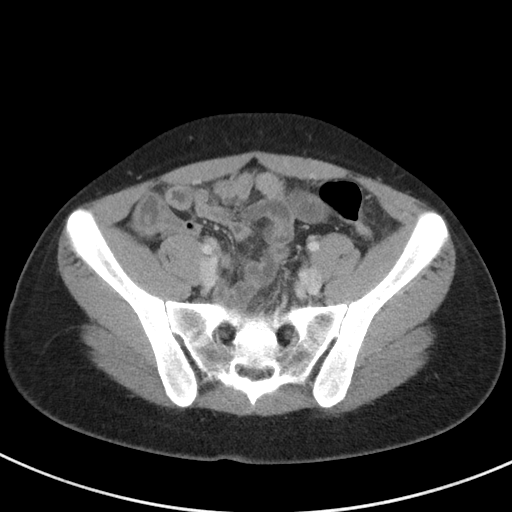
[im 34/74  soft-tissue]
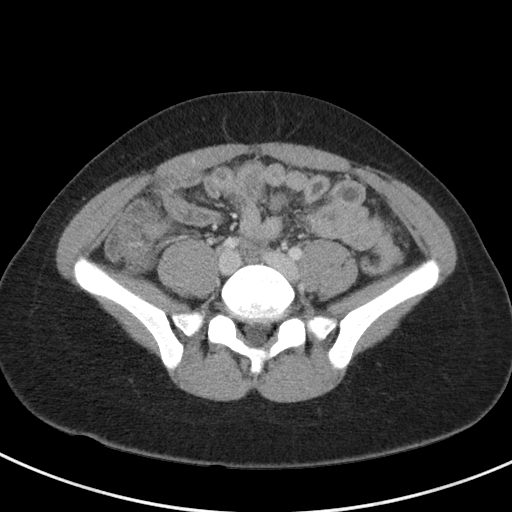
[im 40/74  soft-tissue]
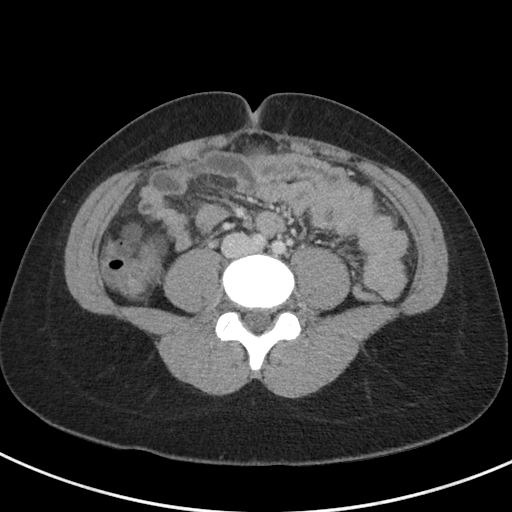
[im 46/74  soft-tissue]
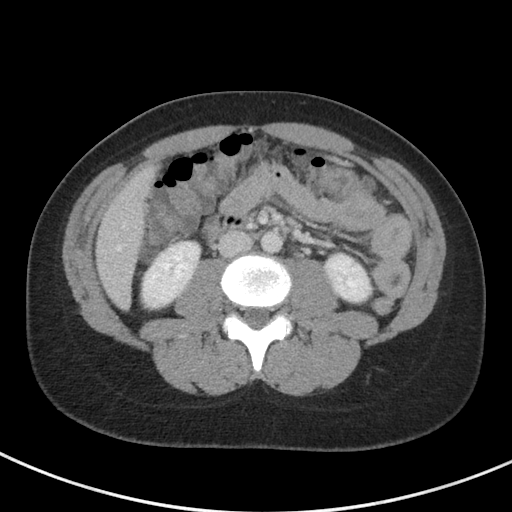
[im 52/74  soft-tissue]
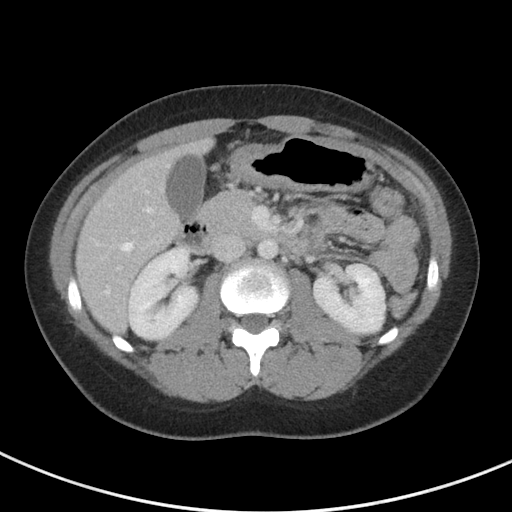
[im 52/74  bone]
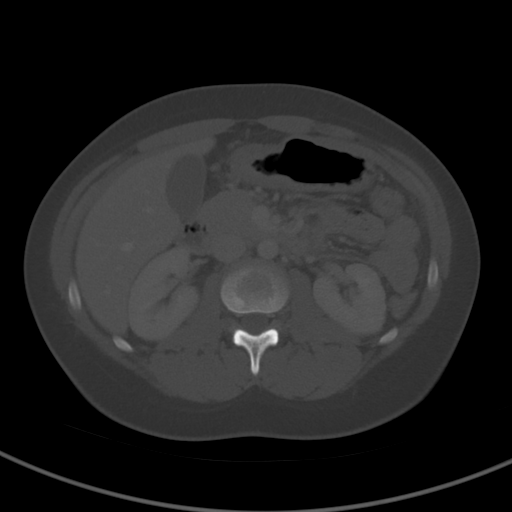
[im 58/74  soft-tissue]
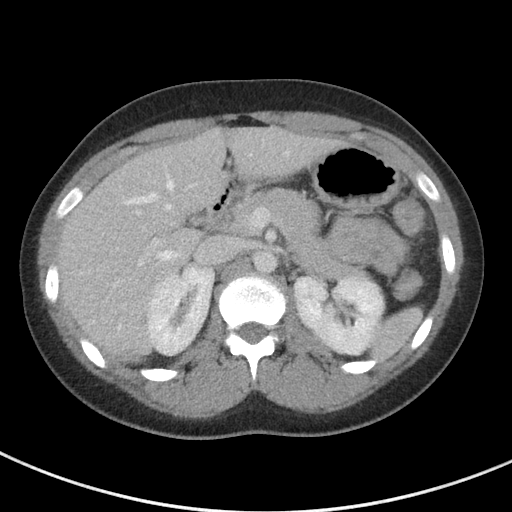
[im 64/74  soft-tissue]
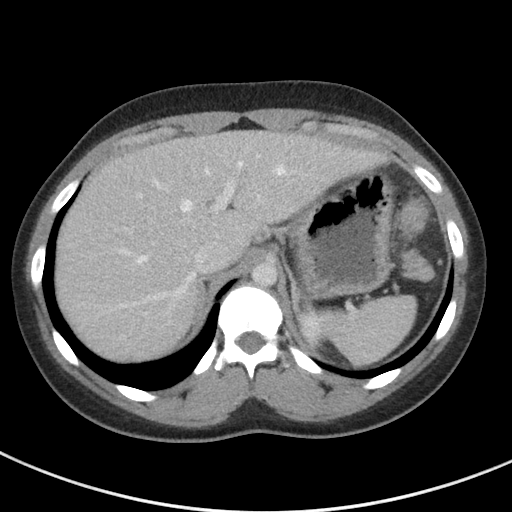
[im 70/74  soft-tissue]
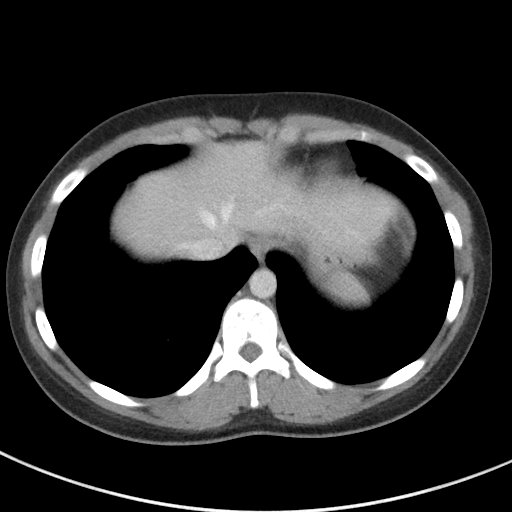

[Series 5: coronal st · coronal · 0.62mm/px · 3 of 75 slices shown]
[im 25/75  soft-tissue]
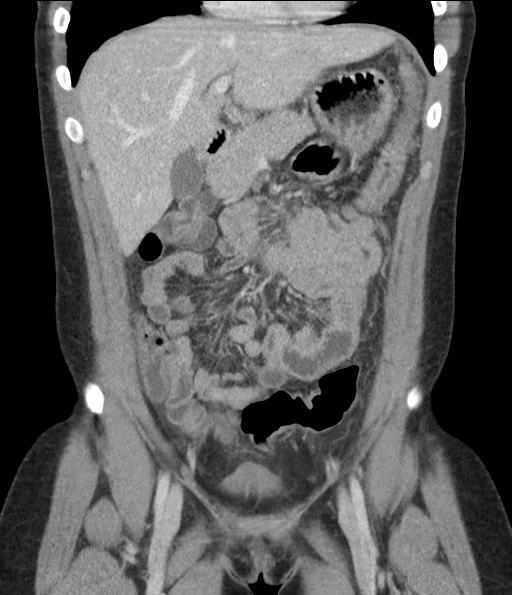
[im 33/75  soft-tissue]
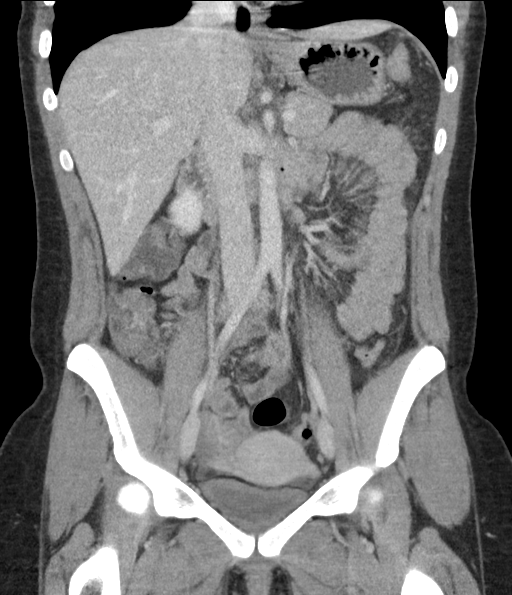
[im 42/75  soft-tissue]
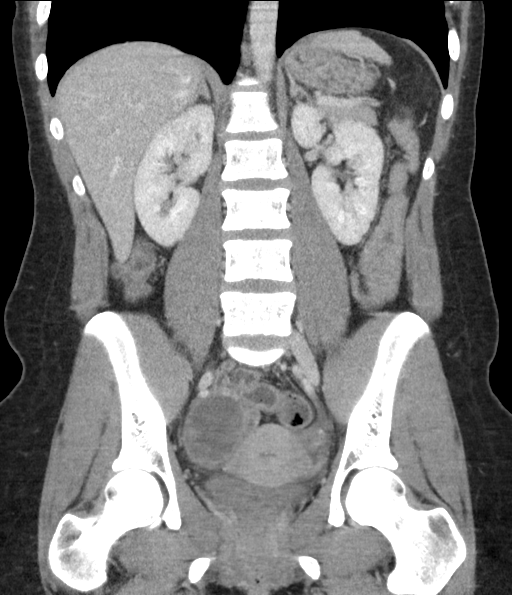

[15 of 46 positions shown; findings below may reference images not displayed]

FINDINGS: Lower chest:  Unremarkable

Hepatobiliary: No focal abnormality within the liver parenchyma.
There is no evidence for gallstones, gallbladder wall thickening, or
pericholecystic fluid. No intrahepatic or extrahepatic biliary
dilation.

Pancreas: No focal mass lesion. No dilatation of the main duct. No
intraparenchymal cyst. No peripancreatic edema.

Spleen: No splenomegaly. No focal mass lesion.

Adrenals/Urinary Tract: No adrenal nodule or mass. Kidneys are
unremarkable. No evidence for hydroureter. The urinary bladder
appears normal for the degree of distention.

Stomach/Bowel: Stomach is nondistended. No gastric wall thickening.
No evidence of outlet obstruction. Duodenum is normally positioned
as is the ligament of Treitz. No small bowel wall thickening. No
small bowel dilatation. The terminal ileum is normal. The appendix
is not visualized, but there is no edema or inflammation in the
region of the cecum. No gross colonic mass. There may be some
pericolonic edema/ inflammation in the region of the splenic
flexure, but this region of the colon is not well distended.

Vascular/Lymphatic: No abdominal aortic aneurysm. No abdominal
aortic atherosclerotic calcification. There is no gastrohepatic or
hepatoduodenal ligament lymphadenopathy. No intraperitoneal or
retroperitoneal lymphadenopathy. No pelvic sidewall lymphadenopathy.

Reproductive: Uterus unremarkable. No left adnexal mass. 3.6 x 6.1 x
4.1 cm complex cystic mass identified right adnexal space.
Serpiginous, a tubular fluid-filled structure running along the
medial aspect of this cystic lesion may be dilated fallopian tube,
but lack of oral contrast on today's study hinders assessment and
this may simply represent small bowel.

Other: Small volume intraperitoneal free fluid identified in the
cul-de-sac.

Musculoskeletal: Bone windows reveal no worrisome lytic or sclerotic
osseous lesions.
IMPRESSION: 1. 4 x 6 x 4 cm complex cystic lesion right adnexal space with small
volume free fluid in the posterior right adnexal region and
cul-de-sac. This could be a complex ovarian cyst although ovarian
torsion and TOA cannot be excluded. Serpiginous tubular structure
running along the medial aspect of the lesion could represent a
dilated fallopian tube, but given that no oral contrast was
administered for this study, unopacified small bowel could also
produce this appearance. Pelvic ultrasound may prove helpful to
further evaluate.
2. Question mild pericolonic edema/inflammation around the splenic
flexure, but this area of colon is not well distended .

## 2019-10-16 ENCOUNTER — Encounter: Payer: Self-pay | Admitting: Obstetrics and Gynecology

## 2019-10-16 ENCOUNTER — Other Ambulatory Visit (HOSPITAL_COMMUNITY)
Admission: RE | Admit: 2019-10-16 | Discharge: 2019-10-16 | Disposition: A | Discharge status: Home / Self Care | Payer: Medicaid Other | Source: Ambulatory Visit | Attending: Obstetrics and Gynecology | Admitting: Obstetrics and Gynecology

## 2019-10-16 ENCOUNTER — Ambulatory Visit (INDEPENDENT_AMBULATORY_CARE_PROVIDER_SITE_OTHER): Payer: Medicaid Other | Admitting: Obstetrics and Gynecology

## 2019-10-16 ENCOUNTER — Other Ambulatory Visit: Payer: Self-pay

## 2019-10-16 VITALS — BP 120/70 | Ht 60.0 in | Wt 133.0 lb

## 2019-10-16 DIAGNOSIS — Z113 Encounter for screening for infections with a predominantly sexual mode of transmission: Secondary | ICD-10-CM

## 2019-10-16 DIAGNOSIS — Z124 Encounter for screening for malignant neoplasm of cervix: Secondary | ICD-10-CM | POA: Diagnosis present

## 2019-10-16 DIAGNOSIS — Z319 Encounter for procreative management, unspecified: Secondary | ICD-10-CM

## 2019-10-16 DIAGNOSIS — N9489 Other specified conditions associated with female genital organs and menstrual cycle: Secondary | ICD-10-CM

## 2019-10-16 DIAGNOSIS — Z0000 Encounter for general adult medical examination without abnormal findings: Secondary | ICD-10-CM | POA: Diagnosis not present

## 2019-10-16 DIAGNOSIS — Z1329 Encounter for screening for other suspected endocrine disorder: Secondary | ICD-10-CM

## 2019-10-16 DIAGNOSIS — N926 Irregular menstruation, unspecified: Secondary | ICD-10-CM

## 2019-10-16 DIAGNOSIS — Z130 Encounter for screening for diseases of the blood and blood-forming organs and certain disorders involving the immune mechanism: Secondary | ICD-10-CM

## 2019-10-16 NOTE — Patient Instructions (Signed)
Steps to Quit Smoking Smoking tobacco is the leading cause of preventable death. It can affect almost every organ in the body. Smoking puts you and those around you at risk for developing many serious chronic diseases. Quitting smoking can be difficult, but it is one of the best things that you can do for your health. It is never too late to quit. How do I get ready to quit? When you decide to quit smoking, create a plan to help you succeed. Before you quit:  Pick a date to quit. Set a date within the next 2 weeks to give you time to prepare.  Write down the reasons why you are quitting. Keep this list in places where you will see it often.  Tell your family, friends, and co-workers that you are quitting. Support from your loved ones can make quitting easier.  Talk with your health care provider about your options for quitting smoking.  Find out what treatment options are covered by your health insurance.  Identify people, places, things, and activities that make you want to smoke (triggers). Avoid them. What first steps can I take to quit smoking?  Throw away all cigarettes at home, at work, and in your car.  Throw away smoking accessories, such as ashtrays and lighters.  Clean your car. Make sure to empty the ashtray.  Clean your home, including curtains and carpets. What strategies can I use to quit smoking? Talk with your health care provider about combining strategies, such as taking medicines while you are also receiving in-person counseling. Using these two strategies together makes you more likely to succeed in quitting than if you used either strategy on its own.  If you are pregnant or breastfeeding, talk with your health care provider about finding counseling or other support strategies to quit smoking. Do not take medicine to help you quit smoking unless your health care provider tells you to do so. To quit smoking: Quit right away  Quit smoking completely, instead of  gradually reducing how much you smoke over a period of time. Research shows that stopping smoking right away is more successful than gradually quitting.  Attend in-person counseling to help you build problem-solving skills. You are more likely to succeed in quitting if you attend counseling sessions regularly. Even short sessions of 10 minutes can be effective. Take medicine You may take medicines to help you quit smoking. Some medicines require a prescription and some you can purchase over-the-counter. Medicines may have nicotine in them to replace the nicotine in cigarettes. Medicines may:  Help to stop cravings.  Help to relieve withdrawal symptoms. Your health care provider may recommend:  Nicotine patches, gum, or lozenges.  Nicotine inhalers or sprays.  Non-nicotine medicine that is taken by mouth. Find resources Find resources and support systems that can help you to quit smoking and remain smoke-free after you quit. These resources are most helpful when you use them often. They include:  Online chats with a counselor.  Telephone quitlines.  Printed self-help materials.  Support groups or group counseling.  Text messaging programs.  Mobile phone apps or applications. Use apps that can help you stick to your quit plan by providing reminders, tips, and encouragement. There are many free apps for mobile devices as well as websites. Examples include Quit Guide from the CDC and smokefree.gov What things can I do to make it easier to quit?   Reach out to your family and friends for support and encouragement. Call telephone quitlines (1-800-QUIT-NOW), reach   out to support groups, or work with a Social worker for support.  Ask people who smoke to avoid smoking around you.  Avoid places that trigger you to smoke, such as bars, parties, or smoke-break areas at work.  Spend time with people who do not smoke.  Lessen the stress in your life. Stress can be a smoking trigger for some  people. To lessen stress, try: ? Exercising regularly. ? Doing deep-breathing exercises. ? Doing yoga. ? Meditating. ? Performing a body scan. This involves closing your eyes, scanning your body from head to toe, and noticing which parts of your body are particularly tense. Try to relax the muscles in those areas. How will I feel when I quit smoking? Day 1 to 3 weeks Within the first 24 hours of quitting smoking, you may start to feel withdrawal symptoms. These symptoms are usually most noticeable 2-3 days after quitting, but they usually do not last for more than 2-3 weeks. You may experience these symptoms:  Mood swings.  Restlessness, anxiety, or irritability.  Trouble concentrating.  Dizziness.  Strong cravings for sugary foods and nicotine.  Mild weight gain.  Constipation.  Nausea.  Coughing or a sore throat.  Changes in how the medicines that you take for unrelated issues work in your body.  Depression.  Trouble sleeping (insomnia). Week 3 and afterward After the first 2-3 weeks of quitting, you may start to notice more positive results, such as:  Improved sense of smell and taste.  Decreased coughing and sore throat.  Slower heart rate.  Lower blood pressure.  Clearer skin.  The ability to breathe more easily.  Fewer sick days. Quitting smoking can be very challenging. Do not get discouraged if you are not successful the first time. Some people need to make many attempts to quit before they achieve long-term success. Do your best to stick to your quit plan, and talk with your health care provider if you have any questions or concerns. Summary  Smoking tobacco is the leading cause of preventable death. Quitting smoking is one of the best things that you can do for your health.  When you decide to quit smoking, create a plan to help you succeed.  Quit smoking right away, not slowly over a period of time.  When you start quitting, seek help from your  health care provider, family, or friends. This information is not intended to replace advice given to you by your health care provider. Make sure you discuss any questions you have with your health care provider. Document Revised: 06/12/2019 Document Reviewed: 12/06/2018 Elsevier Patient Education  [place].   Pregnancy and Smoking Smoking during pregnancy is unhealthy for you and your baby. Smoke from cigarettes, e-cigarettes, pipes, and cigars contains many chemicals that can cause cancer (carcinogens). These products also contain a stimulant drug (nicotine). When you smoke, harmful substances that you breathe in enter your bloodstream and can be passed on to your baby. This can affect your baby's development. If you are planning to become pregnant or have recently become pregnant, talk with your health care provider about quitting smoking. You have a much better chance of having a healthy pregnancy and a healthy baby if you do not smoke while you are pregnant. How does smoking affect me? Smoking increases your risk for many long-term (chronic) diseases. These diseases include cancer, lung diseases, and heart disease. Smoking during pregnancy increases your risk of:  Losing the pregnancy (miscarriage or stillbirth).  Giving birth too early (premature birth).  Pregnancy outside of the uterus (tubal pregnancy).  Problems with the placenta, which is the organ that provides the baby nourishment and oxygen. These problems may include: ? Attachment of the placenta over the opening of the uterus (placenta previa). ? Detachment of the placenta before the baby's birth (placental abruption).  Having your water break before labor begins. How does smoking affect my baby? Before birth Smoking during pregnancy:  Decreases blood flow and oxygen to your baby.  Increases your baby's risk of birth defects, such as heart defects.  Increases your baby's heart rate.  Slows your baby's growth  in the uterus (intrauterine growth retardation). After birth Babies born to women who smoked during pregnancy may:  Have symptoms of nicotine withdrawal.  Be born with a cleft lip, cleft palate, or other facial deformities.  Be too small at birth.  Have a high risk of: ? Serious health problems or lifelong disabilities. These may result in the long-term need for certain medicines, therapies, or other treatments. ? Sudden infant death syndrome (SIDS). Follow these instructions at home:   Do not use any products that contain nicotine or tobacco, such as cigarettes, e-cigarettes, and chewing tobacco. If you need help quitting, ask your health care provider.  Talk with your health care provider about support strategies to quit smoking. Some methods to consider include: ? Counseling (smoking cessation counseling). ? Psychotherapy. ? Acupuncture. ? Hypnosis. ? Telephone hotlines for people trying to quit.  Do not take nicotine supplements or medicine to help you quit smoking unless your health care provider tells you to do so.  Avoid secondhand smoke. Ask people who smoke to avoid smoking around you.  Identify people, places, things, and activities that make you want to smoke (triggers). Avoid them. Where to find more information Learn more about smoking during pregnancy and quitting smoking from:  March of Dimes: www.marchofdimes.org  [place].S. Department of Health and Human Services: women.smokefree.gov  American Cancer Society: www.cancer.org  American Heart Association: www.heart.[place]: www.cancer.gov For help to quit smoking:  National smoking cessation telephone hotline: 1-800-QUIT NOW (602)436-2100) Contact a health care provider if:  You are struggling to quit smoking.  You are a smoker and you become pregnant or plan to become pregnant.  You start smoking again after giving birth. Summary  Smoking during pregnancy is unhealthy for you and your  baby.  Tobacco smoke contains harmful substances that can affect a baby's health and development.  Smoking increases the risk for serious problems, such as miscarriage, birth defects, or premature birth.  If you need help to quit smoking, talk to your health care provider and ask about support strategies such as counseling. This information is not intended to replace advice given to you by your health care provider. Make sure you discuss any questions you have with your health care provider. Document Revised: 04/17/2019 Document Reviewed: 04/17/2019 Elsevier Patient Education  [place], Adult After being diagnosed with an anxiety disorder, you may be relieved to know why you have felt or behaved a certain way. You may also feel overwhelmed about the treatment ahead and what it will mean for your life. With care and support, you can manage this condition and recover from it. How to manage lifestyle changes Managing stress and anxiety  Stress is your body's reaction to life changes and events, both good and bad. Most stress will last just a few hours, but stress can be ongoing and can lead to  more than just stress. Although stress can play a major role in anxiety, it is not the same as anxiety. Stress is usually caused by something external, such as a deadline, test, or competition. Stress normally passes after the triggering event has ended.  Anxiety is caused by something internal, such as imagining a terrible outcome or worrying that something will go wrong that will devastate you. Anxiety often does not go away even after the triggering event is over, and it can become long-term (chronic) worry. It is important to understand the differences between stress and anxiety and to manage your stress effectively so that it does not lead to an anxious response. Talk with your health care provider or a counselor to learn more about reducing anxiety and stress. He or she may  suggest tension reduction techniques, such as:  Music therapy. This can include creating or listening to music that you enjoy and that inspires you.  Mindfulness-based meditation. This involves being aware of your normal breaths while not trying to control your breathing. It can be done while sitting or walking.  Centering prayer. This involves focusing on a word, phrase, or sacred image that means something to you and brings you peace.  Deep breathing. To do this, expand your stomach and inhale slowly through your nose. Hold your breath for 3-5 seconds. Then exhale slowly, letting your stomach muscles relax.  Self-talk. This involves identifying thought patterns that lead to anxiety reactions and changing those patterns.  Muscle relaxation. This involves tensing muscles and then relaxing them. Choose a tension reduction technique that suits your lifestyle and personality. These techniques take time and practice. Set aside 5-15 minutes a day to do them. Therapists can offer counseling and training in these techniques. The training to help with anxiety may be covered by some insurance plans. Other things you can do to manage stress and anxiety include:  Keeping a stress/anxiety diary. This can help you learn what triggers your reaction and then learn ways to manage your response.  Thinking about how you react to certain situations. You may not be able to control everything, but you can control your response.  Making time for activities that help you relax and not feeling guilty about spending your time in this way.  Visual imagery and yoga can help you stay calm and relax.  Medicines Medicines can help ease symptoms. Medicines for anxiety include:  Anti-anxiety drugs.  Antidepressants. Medicines are often used as a primary treatment for anxiety disorder. Medicines will be prescribed by a health care provider. When used together, medicines, psychotherapy, and tension reduction techniques  may be the most effective treatment. Relationships Relationships can play a big part in helping you recover. Try to spend more time connecting with trusted friends and family members. Consider going to couples counseling, taking family education classes, or going to family therapy. Therapy can help you and others better understand your condition. How to recognize changes in your anxiety Everyone responds differently to treatment for anxiety. Recovery from anxiety happens when symptoms decrease and stop interfering with your daily activities at home or work. This may mean that you will start to:  Have better concentration and focus. Worry will interfere less in your daily thinking.  Sleep better.  Be less irritable.  Have more energy.  Have improved memory. It is important to recognize when your condition is getting worse. Contact your health care provider if your symptoms interfere with home or work and you feel like your condition is not  improving. Follow these instructions at home: Activity  Exercise. Most adults should do the following: ? Exercise for at least 150 minutes each week. The exercise should increase your heart rate and make you sweat (moderate-intensity exercise). ? Strengthening exercises at least twice a week.  Get the right amount and quality of sleep. Most adults need 7-9 hours of sleep each night. Lifestyle   Eat a healthy diet that includes plenty of vegetables, fruits, whole grains, low-fat dairy products, and lean protein. Do not eat a lot of foods that are high in solid fats, added sugars, or salt.  Make choices that simplify your life.  Do not use any products that contain nicotine or tobacco, such as cigarettes, e-cigarettes, and chewing tobacco. If you need help quitting, ask your health care provider.  Avoid caffeine, alcohol, and certain over-the-counter cold medicines. These may make you feel worse. Ask your pharmacist which medicines to avoid. General  instructions  Take over-the-counter and prescription medicines only as told by your health care provider.  Keep all follow-up visits as told by your health care provider. This is important. Where to find support You can get help and support from these sources:  Self-help groups.  Online and [employer].  A trusted spiritual leader.  Couples counseling.  Family education classes.  Family therapy. Where to find more information You may find that joining a support group helps you deal with your anxiety. The following sources can help you locate counselors or support groups near you:  [place]: www.mentalhealthamerica.net  Anxiety and Depression Association of [place] (ADAA): https://www.[place].net/  National Alliance on Mental Illness (NAMI): www.nami.org Contact a health care provider if you:  Have a hard time staying focused or finishing daily tasks.  Spend many hours a day feeling worried about everyday life.  Become exhausted by worry.  Start to have headaches, feel tense, or have nausea.  Urinate more than normal.  Have diarrhea. Get help right away if you have:  A racing heart and shortness of breath.  Thoughts of hurting yourself or others. If you ever feel like you may hurt yourself or others, or have thoughts about taking your own life, get help right away. You can go to your nearest emergency department or call:  Your local emergency services (911 in the [place]  A suicide crisis helpline, such as the [facility] at (919)781-1790. This is open 24 hours a day. Summary  Taking steps to learn and use tension reduction techniques can help calm you and help prevent triggering an anxiety reaction.  When used together, medicines, psychotherapy, and tension reduction techniques may be the most effective treatment.  Family, friends, and partners can play a big part in helping you recover from an anxiety disorder. This  information is not intended to replace advice given to you by your health care provider. Make sure you discuss any questions you have with your health care provider. Document Revised: 02/17/2019 Document Reviewed: 02/17/2019 Elsevier Patient Education  [place].

## 2019-10-16 NOTE — Progress Notes (Signed)
Gynecology Annual Exam  PCP: Patient, No Pcp Per  Chief Complaint:  Chief Complaint  Patient presents with  . Gynecologic Exam    Would like to conceive     History of Present Illness: Patient is a 24 y.o. G1P0010 presents for annual exam. The patient has no complaints today.   LMP: Patient's last menstrual period was 10/15/2019 (exact date). Menarche:16 Average Interval: regular, 28 days Duration of flow: 4 days Heavy Menses: yes.  She reports she changing a super tampon every hour, no passage of large clots. Clots: no Intermenstrual Bleeding: no Postcoital Bleeding: no Dysmenorrhea: mild cramps  The patient is sexually active. She currently uses none for contraception. She admits to dyspareunia.  There is no notable family history of breast or ovarian cancer in her family.  The patient has regular exercise: walks 10 minutes to andfrom work each day.    The patient denies current symptoms of depression.  She reports that she feels she is an anxious person. Desires to avoid pharmaceutical management at this time.   GAD7:5 PHQ9:6   Hardin Negus Denzer reports that she has been trying the for about 1 year.  She had a Nexplanon removed 1 year ago.  She reports regular intercourse around the time that she ovulates.  She has some pain with intercourse which is positional.  Pain is new in last couple of months. Pain occurs with deep penetration.  She had chlamydia twice.  She has a history of tubo-ovarian abscess.  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  She currently smokes 4 cigarettes a day.  She has cut back from 1 pack a day.  She has smoked for 2 to 3 years.  She feels like she knows she needs to stop.  She reports that she uses alcohol.  She has a 1-2 shots every 2 weeks.  She is not currently taking a prenatal vitamin.   Review of Systems: ROS  Past Medical History:  Past Medical History:  Diagnosis Date  . Ovarian cyst   . PID (pelvic  inflammatory disease) 01-2016    Past Surgical History:  Past Surgical History:  Procedure Laterality Date  . LAPAROSCOPIC OVARIAN CYSTECTOMY Left 02/14/2016   Procedure: LAPAROSCOPY WITH Left Oophorectomy;  Surgeon: Malachy Mood, MD;  Location: [facility] ORS;  Service: Gynecology;  Laterality: Left;  . OVARIAN CYST REMOVAL      Gynecologic History:  Patient's last menstrual period was 10/15/2019 (exact date). Contraception: none Last Pap: Results were: Never  Obstetric History: G1P0010  Family History:  Family History  Problem Relation Age of Onset  . Diabetes Paternal Grandfather     Social History:  Social History   Socioeconomic History  . Marital status: Single    Spouse name: Not on file  . Number of children: Not on file  . Years of education: Not on file  . Highest education level: Not on file  Occupational History  . Not on file  Tobacco Use  . Smoking status: Current Every Day Smoker    Packs/day: 0.25    Years: 1.00    Pack years: 0.25    Types: Cigarettes  . Smokeless tobacco: Never Used  Substance and Sexual Activity  . Alcohol use: No  . Drug use: No  . Sexual activity: Yes    Birth control/protection: None  Other Topics Concern  . Not on file  Social History Narrative  . Not on file   Social Determinants of  Health   Financial Resource Strain:   . Difficulty of Paying Living Expenses: Not on file  Food Insecurity:   . Worried About Charity fundraiser in the Last Year: Not on file  . Ran Out of Food in the Last Year: Not on file  Transportation Needs:   . Lack of Transportation (Medical): Not on file  . Lack of Transportation (Non-Medical): Not on file  Physical Activity:   . Days of Exercise per Week: Not on file  . Minutes of Exercise per Session: Not on file  Stress:   . Feeling of Stress : Not on file  Social Connections:   . Frequency of Communication with Friends and Family: Not on file  . Frequency of Social Gatherings with  Friends and Family: Not on file  . Attends Religious Services: Not on file  . Active Member of Clubs or Organizations: Not on file  . Attends Archivist Meetings: Not on file  . Marital Status: Not on file  Intimate Partner Violence:   . Fear of Current or Ex-Partner: Not on file  . Emotionally Abused: Not on file  . Physically Abused: Not on file  . Sexually Abused: Not on file    Allergies:  No Known Allergies  Medications: Prior to Admission medications   Not on File    Physical Exam Vitals: Blood pressure 120/70, height 5' (1.524 m), weight 133 lb (60.3 kg), last menstrual period 10/15/2019.  General: NAD HEENT: normocephalic, anicteric Thyroid: no enlargement, no palpable nodules Pulmonary: No increased work of breathing, CTAB Cardiovascular: RRR, distal pulses 2+ Breast: Breast symmetrical, no tenderness, no palpable nodules or masses, no skin or nipple retraction present, no nipple discharge.  No axillary or supraclavicular lymphadenopathy. Abdomen: NABS, soft, non-tender, non-distended.  Umbilicus without lesions.  No hepatomegaly, splenomegaly or masses palpable. No evidence of hernia  Genitourinary:  External: Normal external female genitalia.  Normal urethral meatus, normal Bartholin's and Skene's glands.    Vagina: Normal vaginal mucosa, no evidence of prolapse.    Cervix: Grossly normal in appearance, no bleeding  Uterus: Non-enlarged, mobile, normal contour.  No CMT  Adnexa: ovaries non-enlarged, no adnexal masses  Rectal: deferred  Lymphatic: no evidence of inguinal lymphadenopathy Extremities: no edema, erythema, or tenderness Neurologic: Grossly intact Psychiatric: mood appropriate, affect full  Female chaperone present for pelvic and breast  portions of the physical exam    Assessment: 24 y.o. G1P0010 routine annual exam  Plan: Problem List Items Addressed This Visit    None    Visit Diagnoses    Healthcare maintenance    -  Primary    Relevant Orders   CBC   Thyroid disorder screen       Relevant Orders   TSH + free T4   Infertility management       Relevant Orders   Hysterosalpingogram (HSG) for infertility   Anti mullerian hormone   Menstrual abnormality       Relevant Orders   TSH + free T4   Prolactin   FSH   Screening, anemia, deficiency, iron       Relevant Orders   CBC   Screening examination for STD (sexually transmitted disease)       Relevant Orders   Cytology - PAP   Screening for cervical cancer       Relevant Orders   Cytology - PAP   Adnexal mass       Relevant Orders   [place] GYN Transvaginal  1)  Gardasil Series discussed and if applicable offered to patient - Patient has previously completed 3 shot series   2) STI screening  was offered and accepted  3)  ASCCP guidelines and rational discussed.  Patient opts for every 3 years screening interval  4) Contraception - the patient is currently using  none.  She is attempting to conceive in the near future We discussed safe sex practices to reduce her furture risk of STI's.    5) Infertility- discussed basic evaluation. Ordered labs and sent message about scheduling HSG. Will plan follow up to discuss further and order semen analysis. Will Need pelvic US for right sided adnexal mass. Discussed smoking cessation. Advised to start PNV. Discussed timing of intercourse for conception.  6) Counseled regarding smoking cessation.  7) Counseled regarding anxiety management.   8) Return in about 4 weeks (around 11/13/2019) for GYN visit and [place].  Adrian Prows MD [facility], [facility] 10/16/2019 2:48 PM

## 2019-10-20 LAB — CYTOLOGY - PAP
Chlamydia: NEGATIVE
Comment: NEGATIVE
Comment: NEGATIVE
Comment: NORMAL
Diagnosis: NEGATIVE
Neisseria Gonorrhea: NEGATIVE
Trichomonas: POSITIVE — AB

## 2019-10-21 LAB — CBC
Hematocrit: 38.2 % (ref 34.0–46.6)
Hemoglobin: 12.2 g/dL (ref 11.1–15.9)
MCH: 31.9 pg (ref 26.6–33.0)
MCHC: 31.9 g/dL (ref 31.5–35.7)
MCV: 100 fL — ABNORMAL HIGH (ref 79–97)
Platelets: 329 10*3/uL (ref 150–450)
RBC: 3.82 x10E6/uL (ref 3.77–5.28)
RDW: 13.4 % (ref 11.7–15.4)
WBC: 5 10*3/uL (ref 3.4–10.8)

## 2019-10-21 LAB — FOLLICLE STIMULATING HORMONE: FSH: 7.5 m[IU]/mL

## 2019-10-21 LAB — PROLACTIN: Prolactin: 15.3 ng/mL (ref 4.8–23.3)

## 2019-10-21 LAB — TSH+FREE T4
Free T4: 1.18 ng/dL (ref 0.82–1.77)
TSH: 1.36 u[IU]/mL (ref 0.450–4.500)

## 2019-10-21 LAB — ANTI MULLERIAN HORMONE: ANTI-MULLERIAN HORMONE (AMH): 1.82 ng/mL

## 2019-11-12 ENCOUNTER — Ambulatory Visit: Payer: Medicaid Other | Attending: Obstetrics and Gynecology

## 2019-11-13 ENCOUNTER — Ambulatory Visit: Payer: Medicaid Other | Admitting: Obstetrics and Gynecology

## 2019-11-13 ENCOUNTER — Other Ambulatory Visit: Payer: Medicaid Other

## 2020-06-03 ENCOUNTER — Ambulatory Visit: Payer: Medicaid Other

## 2020-06-16 ENCOUNTER — Ambulatory Visit: Payer: Self-pay

## 2020-06-16 ENCOUNTER — Encounter: Payer: Self-pay | Admitting: Physician Assistant

## 2020-06-16 ENCOUNTER — Other Ambulatory Visit: Payer: Self-pay

## 2020-06-16 ENCOUNTER — Ambulatory Visit: Payer: Self-pay | Admitting: Physician Assistant

## 2020-06-16 DIAGNOSIS — Z113 Encounter for screening for infections with a predominantly sexual mode of transmission: Secondary | ICD-10-CM

## 2020-06-16 LAB — WET PREP FOR TRICH, YEAST, CLUE
Trichomonas Exam: NEGATIVE
Yeast Exam: NEGATIVE

## 2020-06-16 NOTE — Progress Notes (Signed)
[facility] Department STI clinic/screening visit  Subjective:  Jamie Ryan is a 24 y.o. female being seen today for an STI screening visit. The patient reports they do not have symptoms.  Patient reports that they do not desire a pregnancy in the next year.   They reported they are not interested in discussing contraception today.  Patient's last menstrual period was 06/13/2020.   Patient has the following medical conditions:  There are no problems to display for this patient.   Chief Complaint  Patient presents with  . SEXUALLY TRANSMITTED DISEASE    screening    HPI  Patient reports that she is not having any symptoms but her partner is having symptoms so she would like a screening.  Denies chronic conditions and regular medicines.  Reports last HIV test was in 2020 and last pap 08/2019.  Using condoms as her BCM.    See flowsheet for further details and programmatic requirements.    The following portions of the patient's history were reviewed and updated as appropriate: allergies, current medications, past medical history, past social history, past surgical history and problem list.  Objective:  There were no vitals filed for this visit.  Physical Exam Constitutional:      General: She is not in acute distress.    Appearance: Normal appearance.  HENT:     Head: Normocephalic and atraumatic.     Comments: No nits, lice, or hair loss. No cervical, supraclavicular or axillary adenopathy.    Mouth/Throat:     Mouth: Mucous membranes are moist.     Pharynx: Oropharynx is clear. No oropharyngeal exudate or posterior oropharyngeal erythema.  Eyes:     Conjunctiva/sclera: Conjunctivae normal.  Pulmonary:     Effort: Pulmonary effort is normal.  Abdominal:     Palpations: Abdomen is soft. There is no mass.     Tenderness: There is no abdominal tenderness. There is no guarding or rebound.  Genitourinary:    General: Normal vulva.     Rectum: Normal.      Comments: External genitalia/pubic area without nits, lice, edema, erythema, lesions and inguinal adenopathy. Vagina with normal mucosa and discharge. Cervix without visible lesions. Uterus firm, mobile, nt, no masses,no CMT, no adnexal tenderness or fullness. Musculoskeletal:     Cervical back: Neck supple. No tenderness.  Skin:    General: Skin is warm and dry.     Findings: No bruising, erythema, lesion or rash.  Neurological:     Mental Status: She is alert and oriented to person, place, and time.  Psychiatric:        Mood and Affect: Mood normal.        Behavior: Behavior normal.        Thought Content: Thought content normal.        Judgment: Judgment normal.      Assessment and Plan:  Jamie Ryan is a 24 y.o. female presenting to the [facility] Department for STI screening  1. Screening for STD (sexually transmitted disease) Patient into clinic without symptoms. Counseled patient that if she finds out that her partner tests positive for an infection before her results are back that she can RTC for a treatment appointment. Rec condoms with all sex. Await test results.  Counseled that RN will call if needs to RTC for treatment once results are back. - WET PREP FOR TRICH, YEAST, CLUE - Gonococcus culture - Chlamydia/Gonorrhea [place] [facility] - HIV/HCV [place] [facility] - Syphilis Serology, [facility] [facility]  Return for PRN.  No future appointments.  Jerene Dilling, PA

## 2020-06-16 NOTE — Progress Notes (Signed)
Wet mount reviewed and no treatment needed for wet mount per standing order and per provider verbal order as pt is not having any symptoms. Provider orders completed.

## 2020-06-17 ENCOUNTER — Encounter: Payer: Self-pay | Admitting: Obstetrics and Gynecology

## 2020-06-21 LAB — GONOCOCCUS CULTURE

## 2020-06-23 LAB — HM HIV SCREENING LAB: HM HIV Screening: NEGATIVE

## 2020-06-23 LAB — HM HEPATITIS C SCREENING LAB: HM Hepatitis Screen: NEGATIVE

## 2020-06-24 ENCOUNTER — Telehealth: Payer: Self-pay | Admitting: Family Medicine

## 2020-06-24 ENCOUNTER — Encounter: Payer: Self-pay | Admitting: Family Medicine

## 2020-06-24 NOTE — Telephone Encounter (Signed)
Call to patient to inform of positive gonorrhea & chlamydia  infection.  No answer, left message on machine or unable to [facility].  Marland Kitchenmecred

## 2020-06-27 ENCOUNTER — Ambulatory Visit: Payer: Self-pay | Admitting: Physician Assistant

## 2020-06-27 ENCOUNTER — Other Ambulatory Visit: Payer: Self-pay

## 2020-06-27 ENCOUNTER — Telehealth: Payer: Self-pay | Admitting: Family Medicine

## 2020-06-27 DIAGNOSIS — A5602 Chlamydial vulvovaginitis: Secondary | ICD-10-CM

## 2020-06-27 DIAGNOSIS — A5402 Gonococcal vulvovaginitis, unspecified: Secondary | ICD-10-CM

## 2020-06-27 DIAGNOSIS — Z113 Encounter for screening for infections with a predominantly sexual mode of transmission: Secondary | ICD-10-CM

## 2020-06-27 MED ORDER — AZITHROMYCIN 500 MG PO TABS
1000.0000 mg | ORAL_TABLET | Freq: Once | ORAL | Status: AC
  Administered 2020-06-27: 12:00:00 1000 mg via ORAL

## 2020-06-27 MED ORDER — CEFTRIAXONE SODIUM 500 MG IJ SOLR
500.0000 mg | Freq: Once | INTRAMUSCULAR | Status: AC
  Administered 2020-06-27: 12:00:00 500 mg via INTRAMUSCULAR

## 2020-06-27 NOTE — Telephone Encounter (Signed)
Patient returned call.  Verified by password.  Patient informed of positive GC/CT infection.  Appointment scheduled.  Junious Dresser, RN

## 2020-06-27 NOTE — Progress Notes (Signed)
S:  Patient into clinic for treatment today.  States that she has not started having any symptoms since she was here for her screening visit on 06/16/2020.  Reports that she is having some abdominal cramping since it is about time for her period to start.  NKDA. O:  WDWN female in NAD, A&O x 3, normal work of breathing;  GC and Chlamydia from 03/16/2020 visit are both positive. A/P:  1.  Patient needs treatment for GC and Chlamydia. 2.  Reviewed with patient SE of medicines and when to call clinic if vomits < 2 hr after taking medicine. 3.  No sex for 7 days and until after partner completes treatment. 4.  Treat with Ceftriaxone 500 mg IM and Azithromycin 1 g po DOT today. 5.  Rec condoms with all sex. 6.  RTC for re-screening in 3 months and prn.

## 2020-06-27 NOTE — Progress Notes (Signed)
Pt treated for Gonorrhea and Chlamydia per Antoine Primas, PA order. Pt tolerated well. Counseled pt per provider orders and pt states understanding. Provider orders completed.

## 2020-08-30 ENCOUNTER — Encounter (HOSPITAL_COMMUNITY): Payer: Self-pay | Admitting: Emergency Medicine

## 2020-08-30 ENCOUNTER — Emergency Department (HOSPITAL_COMMUNITY)
Admission: EM | Admit: 2020-08-30 | Discharge: 2020-08-30 | Disposition: A | Discharge status: Home / Self Care | Payer: Medicaid Other | Attending: Emergency Medicine | Admitting: Emergency Medicine

## 2020-08-30 ENCOUNTER — Other Ambulatory Visit: Payer: Self-pay

## 2020-08-30 DIAGNOSIS — R1030 Lower abdominal pain, unspecified: Secondary | ICD-10-CM | POA: Insufficient documentation

## 2020-08-30 DIAGNOSIS — T192XXA Foreign body in vulva and vagina, initial encounter: Secondary | ICD-10-CM | POA: Insufficient documentation

## 2020-08-30 DIAGNOSIS — Z5321 Procedure and treatment not carried out due to patient leaving prior to being seen by health care provider: Secondary | ICD-10-CM | POA: Insufficient documentation

## 2020-08-30 DIAGNOSIS — X58XXXA Exposure to other specified factors, initial encounter: Secondary | ICD-10-CM | POA: Insufficient documentation

## 2020-08-30 LAB — COMPREHENSIVE METABOLIC PANEL
ALT: 17 U/L (ref 0–44)
AST: 22 U/L (ref 15–41)
Albumin: 3.5 g/dL (ref 3.5–5.0)
Alkaline Phosphatase: 70 U/L (ref 38–126)
Anion gap: 13 (ref 5–15)
BUN: 6 mg/dL (ref 6–20)
CO2: 24 mmol/L (ref 22–32)
Calcium: 9.2 mg/dL (ref 8.9–10.3)
Chloride: 100 mmol/L (ref 98–111)
Creatinine, Ser: 0.71 mg/dL (ref 0.44–1.00)
GFR, Estimated: >60 mL/min (ref >60)
Glucose, Bld: 153 mg/dL — ABNORMAL HIGH (ref 70–99)
Potassium: 3.5 mmol/L (ref 3.5–5.1)
Sodium: 137 mmol/L (ref 135–145)
Total Bilirubin: 1 mg/dL (ref 0.3–1.2)
Total Protein: 6.6 g/dL (ref 6.5–8.1)

## 2020-08-30 LAB — CBC
HCT: 43.9 % (ref 36.0–46.0)
Hemoglobin: 14.5 g/dL (ref 12.0–15.0)
MCH: 34.4 pg — ABNORMAL HIGH (ref 26.0–34.0)
MCHC: 33 g/dL (ref 30.0–36.0)
MCV: 104 fL — ABNORMAL HIGH (ref 80.0–100.0)
Platelets: 270 10*3/uL (ref 150–400)
RBC: 4.22 MIL/uL (ref 3.87–5.11)
RDW: 13.7 % (ref 11.5–15.5)
WBC: 9.4 10*3/uL (ref 4.0–10.5)
nRBC: 0 % (ref 0.0–0.2)

## 2020-08-30 LAB — URINALYSIS, ROUTINE W REFLEX MICROSCOPIC
Bilirubin Urine: NEGATIVE
Glucose, UA: NEGATIVE mg/dL
Hgb urine dipstick: NEGATIVE
Ketones, ur: NEGATIVE mg/dL
Leukocytes,Ua: NEGATIVE
Nitrite: NEGATIVE
Protein, ur: NEGATIVE mg/dL
Specific Gravity, Urine: 1.021 (ref 1.005–1.030)
pH: 5 (ref 5.0–8.0)

## 2020-08-30 LAB — LIPASE, BLOOD: Lipase: 21 U/L (ref 11–51)

## 2020-08-30 LAB — I-STAT BETA HCG BLOOD, ED (MC, WL, AP ONLY): I-stat hCG, quantitative: <5 m[IU]/mL (ref <5)

## 2020-08-30 NOTE — ED Notes (Signed)
Pt told tech she was able to get the tampon out and she felt better so she was going to go home

## 2020-08-30 NOTE — ED Triage Notes (Signed)
Pt st's she has a tampon stuck in her vagina xs 2 days and cant get it out Pt c/o lower abd pain

## 2021-03-07 ENCOUNTER — Other Ambulatory Visit: Payer: Self-pay

## 2021-03-07 ENCOUNTER — Ambulatory Visit: Payer: Self-pay | Admitting: Physician Assistant

## 2021-03-07 DIAGNOSIS — Z202 Contact with and (suspected) exposure to infections with a predominantly sexual mode of transmission: Secondary | ICD-10-CM

## 2021-03-07 DIAGNOSIS — Z113 Encounter for screening for infections with a predominantly sexual mode of transmission: Secondary | ICD-10-CM

## 2021-03-07 DIAGNOSIS — A5901 Trichomonal vulvovaginitis: Secondary | ICD-10-CM

## 2021-03-07 LAB — WET PREP FOR TRICH, YEAST, CLUE
Trichomonas Exam: POSITIVE — AB
Yeast Exam: NEGATIVE

## 2021-03-07 MED ORDER — AZITHROMYCIN 500 MG PO TABS
1000.0000 mg | ORAL_TABLET | Freq: Once | ORAL | Status: AC
  Administered 2021-03-07: 1000 mg via ORAL

## 2021-03-07 MED ORDER — CEFTRIAXONE SODIUM 500 MG IJ SOLR
500.0000 mg | Freq: Once | INTRAMUSCULAR | Status: AC
Start: 2021-03-07 — End: 2021-03-07
  Administered 2021-03-07: 500 mg via INTRAMUSCULAR

## 2021-03-07 MED ORDER — METRONIDAZOLE 500 MG PO TABS: 500.0000 mg | ORAL_TABLET | Freq: Two times a day (BID) | ORAL | 0 refills | Status: AC

## 2021-03-08 ENCOUNTER — Encounter: Payer: Self-pay | Admitting: Physician Assistant

## 2021-03-08 NOTE — Progress Notes (Signed)
[facility] Department STI clinic/screening visit  Subjective:  Jamie Ryan is a 25 y.o. female being seen today for an STI screening visit. The patient reports they do have symptoms.  Patient reports that they do not desire a pregnancy in the next year.   They reported they are not interested in discussing contraception today.  No LMP recorded.   Patient has the following medical conditions:  There are no problems to display for this patient.   Chief Complaint  Patient presents with  . SEXUALLY TRANSMITTED DISEASE    screening    HPI  Patient reports that she is a contact to [place] and Chlamydia.  States that she has had cloudy discharge and cramping for a few days.  Denies chronic conditions and regular medicines.  States last HIV test was earlier in 2022 and is not sure when she last had a pap.   LMP 03/01/21 and uses condoms as her BCM.                See flowsheet for further details and programmatic requirements.    The following portions of the patient's history were reviewed and updated as appropriate: allergies, current medications, past medical history, past social history, past surgical history and problem list.  Objective:  There were no vitals filed for this visit.  Physical Exam Constitutional:      General: She is not in acute distress.    Appearance: Normal appearance.  HENT:     Head: Normocephalic and atraumatic.     Comments: No nits,lice, or hair loss. No cervical, supraclavicular or axillary adenopathy.    Mouth/Throat:     Mouth: Mucous membranes are moist.     Pharynx: Oropharynx is clear. No oropharyngeal exudate or posterior oropharyngeal erythema.  Eyes:     Conjunctiva/sclera: Conjunctivae normal.  Pulmonary:     Effort: Pulmonary effort is normal.  Abdominal:     Palpations: Abdomen is soft. There is no mass.     Tenderness: There is no abdominal tenderness. There is no guarding or rebound.  Genitourinary:    General: Normal  vulva.     Rectum: Normal.     Comments: External genitalia/pubic area without nits, lice, edema, erythema, lesions and inguinal adenopathy. Vagina with normal mucosa and small amount of pinkish bloody discharge. Cervix without visible lesions. Uterus firm, mobile, nt, no masses, no CMT, no adnexal tenderness or fullness. Musculoskeletal:     Cervical back: Neck supple. No tenderness.  Skin:    General: Skin is warm and dry.     Findings: No bruising, erythema, lesion or rash.  Neurological:     Mental Status: She is alert and oriented to person, place, and time.  Psychiatric:        Mood and Affect: Mood normal.        Behavior: Behavior normal.        Thought Content: Thought content normal.        Judgment: Judgment normal.      Assessment and Plan:  Jamie Ryan is a 25 y.o. female presenting to the [facility] Department for STI screening  1. Screening for STD (sexually transmitted disease) Patient into clinic without symptoms. Rec condoms with all sex. Await test results.  Counseled that RN will call if needs to RTC for treatment once results are back. - WET PREP FOR TRICH, YEAST, CLUE - Gonococcus culture - Chlamydia/Gonorrhea [place] [facility] - HIV [place] [facility] - Syphilis Serology, [facility]  2. Gonorrhea contact Will treat as a contact to [facility] and cover for Chlamydia with Ceftriaxone 500 mg IM and Azithromycin 1 g po DOT today. No sex for 14 days and until after partner completes treatment. Enc to use OTC antifungal cream if has itching during or just after antibiotic use. - cefTRIAXone (ROCEPHIN) injection 500 mg - azithromycin (ZITHROMAX) tablet 1,000 mg  3. Trichomonal vulvovaginitis Will treat for Trich with Metronidazole 500 mg #14 1 po BID for 7 days with food, no EtOH for 24 hr before and until 72 hr after completing medicine. No sex for 14 days and until after partner completes treatment. - metroNIDAZOLE (FLAGYL) 500 MG tablet; Take 1  tablet (500 mg total) by mouth 2 (two) times daily for 7 days.  Dispense: 14 tablet; Refill: 0     No follow-ups on file.  No future appointments.  Jerene Dilling, PA

## 2021-03-09 LAB — HM HIV SCREENING LAB: HM HIV Screening: NEGATIVE

## 2021-03-11 LAB — GONOCOCCUS CULTURE

## 2021-03-11 NOTE — Progress Notes (Signed)
Chart reviewed by Pharmacist  Suzanne Walker PharmD, Contract Pharmacist at [facility]  

## 2021-03-22 ENCOUNTER — Other Ambulatory Visit: Payer: Self-pay

## 2021-03-22 ENCOUNTER — Ambulatory Visit: Payer: Self-pay | Admitting: Family Medicine

## 2021-03-22 ENCOUNTER — Ambulatory Visit: Payer: Self-pay

## 2021-03-22 DIAGNOSIS — Z708 Other sex counseling: Secondary | ICD-10-CM

## 2021-03-22 DIAGNOSIS — Z113 Encounter for screening for infections with a predominantly sexual mode of transmission: Secondary | ICD-10-CM

## 2021-03-22 LAB — WET PREP FOR TRICH, YEAST, CLUE
Trichomonas Exam: NEGATIVE
Yeast Exam: NEGATIVE

## 2021-03-22 NOTE — Progress Notes (Signed)
Pt here to have a repeat test for Trich.  She has completed her treatment and wanted to make sure that it "it has gone away."  Wet mount results reviewed, no treatment required.  Condoms given. Windle Guard, RN

## 2021-03-22 NOTE — Progress Notes (Signed)
S: patient in clinic for TOC for Trich  O: positive Trich on 03/07/21 A: patient treated at appointment with Metronidazole  mg PO BID x 7 days.  Patient waited 7 days before sex and did not engage with untreated partner.  Patient denies signs/ symptoms. P: patient to  self-collect wet prep       Wet prep negative.  No treatment needed.  Condoms recommended for all sex for STI protection.   Patient declined condoms.      Junious Dresser, FNP

## 2021-03-22 NOTE — Progress Notes (Signed)
In Nurse Clinic at 2:45 pm. [facility] visit listed as Results. Pt explains that she has her test results from 03/07/2021 which were neg. RN confirmed her HIV, Gonorrhea, chlamydia, and syphilis tests were negative. Pt states she wants to be re screened for trich today and is not sure she made that clear when scheduling appt. States she completed the metronidazole as prescribed on 03/07/2021 without problem and denies symptoms. Consult Hilario Quarry, FNP who schedules pt to be seen this pm at 3 pm for STI visit. RN explained to pt and she was sent to clerk for STI appt check in. Josie Saunders, RN

## 2021-03-26 NOTE — Progress Notes (Signed)
Consulted by RN re: patient situation.  Reviewed RN note and agree that it reflects our discussion and my recommendations.   Junious Dresser, FNP

## 2022-07-01 ENCOUNTER — Other Ambulatory Visit: Payer: Self-pay

## 2022-07-01 ENCOUNTER — Emergency Department
Admission: EM | Admit: 2022-07-01 | Discharge: 2022-07-01 | Discharge status: Against Medical Advice | Payer: Commercial Managed Care - HMO | Attending: Emergency Medicine | Admitting: Emergency Medicine

## 2022-07-01 DIAGNOSIS — F17210 Nicotine dependence, cigarettes, uncomplicated: Secondary | ICD-10-CM | POA: Diagnosis not present

## 2022-07-01 DIAGNOSIS — N920 Excessive and frequent menstruation with regular cycle: Secondary | ICD-10-CM | POA: Insufficient documentation

## 2022-07-01 DIAGNOSIS — N939 Abnormal uterine and vaginal bleeding, unspecified: Secondary | ICD-10-CM | POA: Diagnosis present

## 2022-07-01 NOTE — ED Triage Notes (Signed)
Pt to ED with partner, currently on period, states last 3 cycles she has bled more than normal with pea size clots. States is saturating 1 tampon about every 2-3 hours. States bleeding lasts 1 week now as opposed to several days. Denies abnormal uterine cramping, abdominal pain. LBM was yesterday pm. Denies pain, burning with urination.   Denies dizziness, states today and yesterday felt a little more tired than usual.   Ambulatory with steady gait.

## 2022-07-01 NOTE — ED Notes (Signed)
Staff unable to find this patient. She is not in any of the restrooms on the unit and is no where to be found. Pa informed that the patient has seem to have eloped.

## 2022-07-01 NOTE — ED Provider Notes (Signed)
[facility] Emergency Department Provider Note   ____________________________________________   Event Date/Time   First MD Initiated Contact with Patient 07/01/22 1801     (approximate)  I have reviewed the triage vital signs and the nursing notes.   HISTORY  Chief Complaint Vaginal Bleeding    HPI Jamie Ryan is a 26 y.o. female presents to the emergency room today for complaint of menses lasting longer than usual. Patient reports that for the last 3 months she has had menses lasting for 7 days instead of her usual 4 days.  She reports that during this menstrual cycle she is passing pea-sized clots intermittently.  She reports that she is using 1 tampon every 3 hours.  Patient denies any abdominal pain/cramping.  Patient denies having positive pregnancy test.  Patient denies having any dizziness/fatigue/shortness of breath.   Past Medical History:  Diagnosis Date   Ovarian cyst    PID (pelvic inflammatory disease) 01-2016    There are no problems to display for this patient.   Past Surgical History:  Procedure Laterality Date   LAPAROSCOPIC OVARIAN CYSTECTOMY Left 02/14/2016   Procedure: LAPAROSCOPY WITH Left Oophorectomy;  Surgeon: Malachy Mood, MD;  Location: [facility] ORS;  Service: Gynecology;  Laterality: Left;   LEFT OOPHORECTOMY Left    OVARIAN CYST REMOVAL      Prior to Admission medications   Not on File    Allergies Aleve [naproxen]  Family History  Problem Relation Age of Onset   Diabetes Paternal Grandfather     Social History Social History   Tobacco Use   Smoking status: Every Day    Packs/day: 0.25    Years: 1.00    Total pack years: 0.25    Types: Cigarettes   Smokeless tobacco: Never  Vaping Use   Vaping Use: Never used  Substance Use Topics   Alcohol use: Yes   Drug use: Not Currently    Types: Marijuana    Review of Systems  Constitutional: No fever/chills Eyes: No visual changes. ENT: No  sore throat. Cardiovascular: Denies chest pain. Respiratory: Denies shortness of breath. Gastrointestinal: No abdominal pain.  No nausea, no vomiting.  No diarrhea.  No constipation. Genitourinary: Negative for dysuria. Musculoskeletal: Negative for back pain. Skin: Negative for rash. Neurological: Negative for headaches, focal weakness or numbness.   ____________________________________________   PHYSICAL EXAM:  VITAL SIGNS: ED Triage Vitals  Enc Vitals Group     BP 07/01/22 1752 123/86     Pulse Rate 07/01/22 1752 85     Resp 07/01/22 1752 14     Temp 07/01/22 1752 98.3 F (36.8 C)     Temp Source 07/01/22 1752 Oral     SpO2 07/01/22 1752 97 %     Weight 07/01/22 1744 126 lb (57.2 kg)     Height 07/01/22 1744 5' (1.524 m)     Head Circumference --      Peak Flow --      Pain Score 07/01/22 1744 3     Pain Loc --      Pain Edu? --      Excl. in [place]? --     Constitutional: Alert and oriented. Well appearing and in no acute distress. Eyes: Conjunctivae are normal. PERRL. EOMI. Head: Atraumatic. Nose: No congestion/rhinnorhea. Mouth/Throat: Mucous membranes are moist.  Oropharynx non-erythematous. Neck: No stridor.   Cardiovascular: Normal rate, regular rhythm. Grossly normal heart sounds.  Good peripheral circulation. Respiratory: Normal respiratory effort.  No retractions. Lungs  CTAB. Gastrointestinal: Soft and nontender. No distention. No abdominal bruits. No CVA tenderness.  No rebound tenderness or guarding. Musculoskeletal: No lower extremity tenderness nor edema.  No joint effusions. Neurologic:  Normal speech and language. No gross focal neurologic deficits are appreciated. No gait instability. Skin:  Skin is warm, dry and intact. No rash noted. Psychiatric: Mood and affect are normal. Speech and behavior are normal.  ____________________________________________   LABS (all labs ordered are listed, but only abnormal results are displayed)  Labs Reviewed   CBC WITH DIFFERENTIAL/PLATELET  POC URINE PREG, ED   ____________________________________________  EKG   ____________________________________________  RADIOLOGY  ED MD interpretation:    Official radiology report(s): No results found.  ____________________________________________   PROCEDURES  Procedure(s) performed: None  Procedures  Critical Care performed: No  ____________________________________________   INITIAL IMPRESSION / ASSESSMENT AND PLAN / ED COURSE      Jamie Ryan is a 26 y.o. female presents to the emergency room today for complaint of menses lasting longer than usual. Patient reports that for the last 3 months she has had menses lasting for 7 days instead of her usual 4 days.  She reports that during this menstrual cycle she is passing pea-sized clots intermittently.  She reports that she is using 1 tampon every 3 hours.  Patient denies any abdominal pain/cramping.  She denies any exposure to STDs.  She denies any vaginal itching/burning/discharge.  She denies any urinary symptoms.  Patient denies having any dizziness/fatigue/shortness of breath. Patient denies having positive pregnancy test.    Patient states that she has previously been seen by [facility].  We will obtain urine pregnancy and CBC.  I was informed by nursing staff that patient had left without continuing to be seen and without notifying staff that she was sleeping.      ____________________________________________   FINAL CLINICAL IMPRESSION(S) / ED DIAGNOSES  Final diagnoses:  Menorrhagia with regular cycle     ED Discharge Orders     None        Note:  This document was prepared using Dragon voice recognition software and may include unintentional dictation errors.     Willaim Rayas, NP 07/01/22 Karin Golden    Duffy Bruce, MD 07/01/22 2228

## 2022-08-07 ENCOUNTER — Emergency Department
Admission: EM | Admit: 2022-08-07 | Discharge: 2022-08-07 | Disposition: A | Discharge status: Home / Self Care | Payer: Self-pay

## 2022-08-07 NOTE — ED Triage Notes (Signed)
Pt states she was here approximately 1 month ago with heavy vaginal bleeding.  States she had 2 periods in October.  States LMP was 10/22 and was normal.  Pt states she wanted to find out about her pregnancy test and other testing done on her last visit.  States has had other periods where she is bleeding clots.  Pt not bleeding currently.

## 2022-08-07 NOTE — ED Notes (Signed)
Pt states to this RN that she did not want to check in to be seen, she just wanted her results.  Explained to pt that RN was unable to provide and results and that there did not appear to be any results on the chart.  Offered pt to check in and speak with MD about her concerns.  Pt requesting referral to GYN, explained to pt that MD must make referrals and that she should be able to call her GYN directly if this is what she wanted to do.  Again offered pt to complete triage and have her see an MD, pt declines at this time.

## 2022-11-10 ENCOUNTER — Emergency Department
Admission: EM | Admit: 2022-11-10 | Discharge: 2022-11-10 | Disposition: A | Discharge status: Home / Self Care | Payer: Self-pay | Attending: Emergency Medicine | Admitting: Emergency Medicine

## 2022-11-10 ENCOUNTER — Other Ambulatory Visit: Payer: Self-pay

## 2022-11-10 DIAGNOSIS — F1410 Cocaine abuse, uncomplicated: Secondary | ICD-10-CM

## 2022-11-10 DIAGNOSIS — F14188 Cocaine abuse with other cocaine-induced disorder: Secondary | ICD-10-CM | POA: Insufficient documentation

## 2022-11-10 DIAGNOSIS — F1210 Cannabis abuse, uncomplicated: Secondary | ICD-10-CM | POA: Insufficient documentation

## 2022-11-10 DIAGNOSIS — Y904 Blood alcohol level of 80-99 mg/100 ml: Secondary | ICD-10-CM | POA: Insufficient documentation

## 2022-11-10 DIAGNOSIS — F1010 Alcohol abuse, uncomplicated: Secondary | ICD-10-CM | POA: Insufficient documentation

## 2022-11-10 LAB — URINE DRUG SCREEN, QUALITATIVE (ARMC ONLY)
Amphetamines, Ur Screen: NOT DETECTED
Barbiturates, Ur Screen: NOT DETECTED
Benzodiazepine, Ur Scrn: NOT DETECTED
Cannabinoid 50 Ng, Ur ~~LOC~~: POSITIVE — AB
Cocaine Metabolite,Ur ~~LOC~~: POSITIVE — AB
MDMA (Ecstasy)Ur Screen: NOT DETECTED
Methadone Scn, Ur: NOT DETECTED
Opiate, Ur Screen: NOT DETECTED
Phencyclidine (PCP) Ur S: NOT DETECTED
Tricyclic, Ur Screen: NOT DETECTED

## 2022-11-10 LAB — COMPREHENSIVE METABOLIC PANEL
ALT: 11 U/L (ref 0–44)
AST: 18 U/L (ref 15–41)
Albumin: 4.1 g/dL (ref 3.5–5.0)
Alkaline Phosphatase: 89 U/L (ref 38–126)
Anion gap: 10 (ref 5–15)
BUN: <5 mg/dL — ABNORMAL LOW (ref 6–20)
CO2: 24 mmol/L (ref 22–32)
Calcium: 8.7 mg/dL — ABNORMAL LOW (ref 8.9–10.3)
Chloride: 105 mmol/L (ref 98–111)
Creatinine, Ser: 0.61 mg/dL (ref 0.44–1.00)
GFR, Estimated: >60 mL/min (ref >60)
Glucose, Bld: 90 mg/dL (ref 70–99)
Potassium: 3.4 mmol/L — ABNORMAL LOW (ref 3.5–5.1)
Sodium: 139 mmol/L (ref 135–145)
Total Bilirubin: 0.6 mg/dL (ref 0.3–1.2)
Total Protein: 7.9 g/dL (ref 6.5–8.1)

## 2022-11-10 LAB — CBC
HCT: 40.2 % (ref 36.0–46.0)
Hemoglobin: 13.1 g/dL (ref 12.0–15.0)
MCH: 27.6 pg (ref 26.0–34.0)
MCHC: 32.6 g/dL (ref 30.0–36.0)
MCV: 84.6 fL (ref 80.0–100.0)
Platelets: 314 10*3/uL (ref 150–400)
RBC: 4.75 MIL/uL (ref 3.87–5.11)
RDW: 15.6 % — ABNORMAL HIGH (ref 11.5–15.5)
WBC: 9.4 10*3/uL (ref 4.0–10.5)
nRBC: 0 % (ref 0.0–0.2)

## 2022-11-10 LAB — ETHANOL: Alcohol, Ethyl (B): 82 mg/dL — ABNORMAL HIGH (ref <10)

## 2022-11-10 LAB — POC URINE PREG, ED: Preg Test, Ur: NEGATIVE

## 2022-11-10 NOTE — ED Triage Notes (Signed)
Pt here with substance abuse issues and would like help. Pt uses crack. Pt would like to talk to a psychiatrist. Pt denies SI but wants help.

## 2022-11-10 NOTE — ED Notes (Signed)
Pt given shelter list from [facility] and provided belongings to change into street clothing. Pt is AOX4, ambulatory without obvious difficulty.

## 2022-11-10 NOTE — ED Notes (Signed)
Entered room and introduced self to pt. Pt in bed crying. Offered emotional support. Pt denies any needs at this time. Has warm blanket and lights are dimmed. Pt is calm and cooperative. Pt continues to cry. Unable to ask other questions at this time. Dr Starleen Blue now at bedside.

## 2022-11-10 NOTE — ED Provider Notes (Addendum)
[facility] Provider Note    Event Date/Time   First MD Initiated Contact with Patient 11/10/22 1028     (approximate)   History   Addiction Problem   HPI  Jamie Ryan is a 27 y.o. female past medical history of ovarian cyst and cocaine use disorder who presents wanting detox.  Patient uses cocaine daily.  She wants to stop.  She is currently living with her boyfriend in a hotel wants to get established on her own.  She is tearful.  Denies any SI or HI.  Does feel depressed.  Denies any hallucinations.  Drinks alcohol occasionally not daily.  Denies other drug use.  Denies other medical complaints.  Patient wants to know if her blood sugar is elevated today as she did have elevated blood sugar in the past but no known dye gnosis of diabetes.     Past Medical History:  Diagnosis Date   Ovarian cyst    PID (pelvic inflammatory disease) 01-2016    There are no problems to display for this patient.    Physical Exam  Triage Vital Signs: ED Triage Vitals  Enc Vitals Group     BP 11/10/22 1012 (!) 146/99     Pulse Rate 11/10/22 1012 73     Resp 11/10/22 1011 18     Temp 11/10/22 1011 98.4 F (36.9 C)     Temp Source 11/10/22 1011 Oral     SpO2 11/10/22 1012 97 %     Weight 11/10/22 1012 126 lb 1.7 oz (57.2 kg)     Height 11/10/22 1012 5' (1.524 m)     Head Circumference --      Peak Flow --      Pain Score 11/10/22 1012 0     Pain Loc --      Pain Edu? --      Excl. in [place]? --     Most recent vital signs: Vitals:   11/10/22 1011 11/10/22 1012  BP:  (!) 146/99  Pulse:  73  Resp: 18   Temp: 98.4 F (36.9 C)   SpO2:  97%     General: Awake, no distress.  CV:  Good peripheral perfusion.  Resp:  Normal effort.  Abd:  No distention.  Neuro:             Awake, Alert, Oriented x 3  Other:  Patient is tearful but calm cooperative no agitation   ED Results / Procedures / Treatments  Labs (all labs ordered are listed, but only  abnormal results are displayed) Labs Reviewed  COMPREHENSIVE METABOLIC PANEL - Abnormal; Notable for the following components:      Result Value   Potassium 3.4 (*)    BUN <5 (*)    Calcium 8.7 (*)    All other components within normal limits  ETHANOL - Abnormal; Notable for the following components:   Alcohol, Ethyl (B) 82 (*)    All other components within normal limits  CBC - Abnormal; Notable for the following components:   RDW 15.6 (*)    All other components within normal limits  URINE DRUG SCREEN, QUALITATIVE ([facility] ONLY) - Abnormal; Notable for the following components:   Cocaine Metabolite,Ur [place] POSITIVE (*)    Cannabinoid 50 Ng, Ur [place] POSITIVE (*)    All other components within normal limits  POC URINE PREG, ED     EKG     RADIOLOGY    PROCEDURES:  Critical Care performed:  No  Procedures   MEDICATIONS ORDERED IN ED: Medications - No data to display   IMPRESSION / MDM / [place] / ED COURSE  I reviewed the triage vital signs and the nursing notes.                              Patient's presentation is most consistent with exacerbation of chronic illness.  Differential diagnosis includes, but is not limited to, cocaine use disorder, intoxication, withdrawal, major depressive episode, adjustment disorder  Patient is a 27 year old female who uses crack cocaine regularly presents wanting detox.  Last use was this morning.  She is feeling depressed but denies SI or HI denies hallucinations.  She is tearful my evaluation but she is mentating normally without delusions and does not appear overtly psychotic.  Patient has no medical complaints today although she would like to know if her blood sugar is normal or elevated.  She drinks alcohol occasionally no daily use.  Patient initially wanting to speak with a psychiatrist because she wants therapy.  I explained to her that psychiatry in the ED is really to determine whether patient need inpatient  treatment I do not feel that she requires this.  Says she would like to be referred for outpatient psychiatry which I think will be beneficial for her.  Will consult [facility] to help with outpatient resources for cocaine detox but will not be able to place her from the ED.  Do not think patient meets any criteria for needing urgent psychiatric eval.  Patient is EtOH level is 82.  UDS positive for cocaine and marijuana.  The rest of her labs are reassuring.  Glucose is normal at 90.  She was seen by [facility] and they provided her with resources.  She is appropriate for discharge at this time.      FINAL CLINICAL IMPRESSION(S) / ED DIAGNOSES   Final diagnoses:  Cocaine use disorder ([place])     Rx / DC Orders   ED Discharge Orders     None        Note:  This document was prepared using Dragon voice recognition software and may include unintentional dictation errors.   Rada Hay, MD 11/10/22 1038    Rada Hay, MD 11/10/22 1146

## 2022-11-10 NOTE — ED Notes (Signed)
Assisted pt in calling father for ride home. Pt given snack and drink and permitted to wait in room by request.

## 2022-11-10 NOTE — BH Assessment (Signed)
Comprehensive Clinical Assessment (CCA) Screening, Triage and Referral Note  11/10/2022 Jamie Ryan [id number]  Chief Complaint:  Chief Complaint  Patient presents with   Addiction Problem   Visit Diagnosis: Cocaine Use Disorder  Jamie Ryan is a 27 year old female who presents to the ER seeking assistance with substance abuse treatment. She reports of using cocaine on a daily basis. She denies the use of any other mind-altering substances. She denies SI/HI and AV/H.  Writer provided the patient with information and instructions on how to access [facility] ([facility]). Also provided her with the contact information for [facility] Peer Support Worker (H.B). Information for local shelters were provided as well.  ______________________________________ [facility] [address]  [place], [place] [address] [phone]   Patient Reported Information How did you hear about [place]? No data recorded What Is the Reason for Your Visit/Call Today? Cocaine  How Long Has This Been Causing You Problems? > than 6 months  What Do You Feel Would Help You the Most Today? Alcohol or Drug Use Treatment   Have You Recently Had Any Thoughts About Hurting Yourself? No  Are You Planning to Commit Suicide/Harm Yourself At This time? No   Have you Recently Had Thoughts About Jamie Ryan? No  Are You Planning to Harm Someone at This Time? No  Explanation: No data recorded  Have You Used Any Alcohol or Drugs in the Past 24 Hours? Yes  How Long Ago Did You Use Drugs or Alcohol? No data recorded What Did You Use and How Much? Cocaine   Do You Currently Have a Therapist/Psychiatrist? No  Name of Therapist/Psychiatrist: No data recorded  Have You Been Recently Discharged From Any Office Practice or Programs? No  Explanation of Discharge From Practice/Program: No data recorded   CCA Screening Triage Referral Assessment Type of Contact:  Face-to-Face  Telemedicine Service Delivery:   Is this Initial or Reassessment?   Date Telepsych consult ordered in [facility]:    Time Telepsych consult ordered in [facility]:    Location of Assessment: [facility] ED  Provider Location: [facility] ED    Collateral Involvement: No data recorded  Does Patient Have a [place]? No data recorded Name and Contact of Legal Guardian: No data recorded If Minor and Not Living with Parent(s), Who has Custody? No data recorded Is CPS involved or ever been involved? Never  Is APS involved or ever been involved? No data recorded  Patient Determined To Be At Risk for Harm To Self or Others Based on Review of Patient Reported Information or Presenting Complaint? No  Method: No data recorded Availability of Means: No data recorded Intent: No data recorded Notification Required: No data recorded Additional Information for Danger to Others Potential: No data recorded Additional Comments for Danger to Others Potential: No data recorded Are There Guns or Other Weapons in Your Home? No data recorded Types of Guns/Weapons: No data recorded Are These Weapons Safely Secured?                            No data recorded Who Could Verify You Are Able To Have These Secured: No data recorded Do You Have any Outstanding Charges, Pending Court Dates, Parole/Probation? No data recorded Contacted To Inform of Risk of Harm To Self or Others: No data recorded  Does Patient Present under Involuntary Commitment? No data recorded   [place] of Residence: [place]   Patient  Currently Receiving the Following Services: Not Receiving Services   Determination of Need: Emergent (2 hours)   Options For Referral: ED Visit; Outpatient Therapy   Discharge Disposition:     Jamie Fusi MS, LCAS, [facility], [facility] Therapeutic Triage Specialist 11/10/2022 12:39 PM

## 2022-11-10 NOTE — ED Notes (Signed)
Pt belongings:  1 Aqua and orange plaid shirt 1 orange shirt 1 pair of jeans 1 pair of black sneakers 1 pair of black socks

## 2022-11-10 NOTE — ED Notes (Signed)
[facility] at bedside at this time.  

## 2022-11-10 NOTE — ED Notes (Signed)
TTS states they will return to provide shelter resources to pt.

## 2022-12-10 ENCOUNTER — Emergency Department: Payer: Self-pay

## 2022-12-10 ENCOUNTER — Emergency Department
Admission: EM | Admit: 2022-12-10 | Discharge: 2022-12-10 | Disposition: A | Discharge status: Home / Self Care | Payer: Self-pay | Attending: Student in an Organized Health Care Education/Training Program | Admitting: Student in an Organized Health Care Education/Training Program

## 2022-12-10 ENCOUNTER — Other Ambulatory Visit: Payer: Self-pay

## 2022-12-10 ENCOUNTER — Encounter: Payer: Self-pay | Admitting: Emergency Medicine

## 2022-12-10 DIAGNOSIS — L03011 Cellulitis of right finger: Secondary | ICD-10-CM | POA: Insufficient documentation

## 2022-12-10 MED ORDER — BUPIVACAINE HCL (PF) 0.5 % IJ SOLN
30.0000 mL | Freq: Once | INTRAMUSCULAR | Status: AC
  Administered 2022-12-10: 30 mL
  Filled 2022-12-10: qty 30

## 2022-12-10 MED ORDER — OXYCODONE-ACETAMINOPHEN 5-325 MG PO TABS
1.0000 | ORAL_TABLET | Freq: Once | ORAL | Status: AC
  Administered 2022-12-10: 1 via ORAL
  Filled 2022-12-10: qty 1

## 2022-12-10 MED ORDER — CEPHALEXIN 500 MG PO CAPS: 500.0000 mg | ORAL_CAPSULE | Freq: Three times a day (TID) | ORAL | 0 refills | Status: AC

## 2022-12-10 MED ORDER — BACITRACIN ZINC 500 UNIT/GM EX OINT
TOPICAL_OINTMENT | Freq: Once | CUTANEOUS | Status: AC
  Filled 2022-12-10: qty 0.9

## 2022-12-10 MED ORDER — LIDOCAINE HCL (PF) 1 % IJ SOLN
5.0000 mL | Freq: Once | INTRAMUSCULAR | Status: AC
  Administered 2022-12-10: 5 mL via INTRADERMAL
  Filled 2022-12-10: qty 5

## 2022-12-10 NOTE — ED Triage Notes (Signed)
Pt here right pointer finger swelling. Pt denies injury and is still able to bend her finger.

## 2022-12-10 NOTE — ED Notes (Signed)
See triage note  Presents with pain and swelling to right index finger  States she noticed this yesterday  Denies any injury  No fever

## 2022-12-10 NOTE — ED Provider Notes (Signed)
[facility] Provider Note    Event Date/Time   First MD Initiated Contact with Patient 12/10/22 0809     (approximate)   History   Edema   HPI  Jamie Ryan is a 27 y.o. female presents to the ER for evaluation of pain and swelling of the right index finger.  Noticed this a few days ago.  Does not recall any specific injury to the area.  Has never had this happen before.  No fevers or chills.     Physical Exam   Triage Vital Signs: ED Triage Vitals  Enc Vitals Group     BP 12/10/22 0806 (!) 147/101     Pulse Rate 12/10/22 0805 76     Resp 12/10/22 0805 18     Temp 12/10/22 0806 98.4 F (36.9 C)     Temp Source 12/10/22 0806 Oral     SpO2 12/10/22 0805 99 %     Weight 12/10/22 0805 126 lb 1.7 oz (57.2 kg)     Height 12/10/22 0805 5' (1.524 m)     Head Circumference --      Peak Flow --      Pain Score 12/10/22 0805 8     Pain Loc --      Pain Edu? --      Excl. in [place]? --     Most recent vital signs: Vitals:   12/10/22 0805 12/10/22 0806  BP:  (!) 147/101  Pulse: 76   Resp: 18   Temp:  98.4 F (36.9 C)  SpO2: 99%      Constitutional: Alert  Eyes: Conjunctivae are normal.  Head: Atraumatic. Nose: No congestion/rhinnorhea. Mouth/Throat: Mucous membranes are moist.   Neck: Painless ROM.  Cardiovascular:   Good peripheral circulation. Respiratory: Normal respiratory effort.  No retractions.  Gastrointestinal: Soft and nontender.  Musculoskeletal: Swelling and discoloration of the pad of the right index finger consistent with felon.  No tenderness along the flexor tendon sheath.  No findings to suggest paronychia. Neurologic:  MAE spontaneously. No gross focal neurologic deficits are appreciated.  Skin:  Skin is warm, dry and intact. No rash noted. Psychiatric: Mood and affect are normal. Speech and behavior are normal.    ED Results / Procedures / Treatments   Labs (all labs ordered are listed, but only abnormal  results are displayed) Labs Reviewed - No data to display   EKG     RADIOLOGY Please see ED Course for my review and interpretation.  I personally reviewed all radiographic images ordered to evaluate for the above acute complaints and reviewed radiology reports and findings.  These findings were personally discussed with the patient.  Please see medical record for radiology report.    PROCEDURES:  Critical Care performed: No  ..Incision and Drainage  Date/Time: 12/10/2022 9:29 AM  Performed by: Merlyn Lot, MD Authorized by: Merlyn Lot, MD   Consent:    Consent obtained:  Verbal   Consent given by:  Patient   Risks discussed:  Bleeding, infection, incomplete drainage and pain   Alternatives discussed:  Alternative treatment, delayed treatment and observation Location:    Type:  Abscess Anesthesia:    Anesthesia method:  Nerve block   Block location:  Rt index finger   Block needle gauge:  25 G   Block technique:  Ring   Block injection procedure:  Anatomic landmarks identified   Block outcome:  Anesthesia achieved Procedure type:    Complexity:  Simple Procedure  details:    Incision types:  Single straight   Incision depth:  Subcutaneous   Wound management:  Probed and deloculated   Drainage:  Purulent   Drainage amount:  Moderate   Wound treatment:  Wound left open   Packing materials:  None Post-procedure details:    Procedure completion:  Tolerated well, no immediate complications    MEDICATIONS ORDERED IN ED: Medications  bacitracin ointment (has no administration in time range)  lidocaine (PF) (XYLOCAINE) 1 % injection 5 mL (5 mLs Intradermal Given 12/10/22 0841)  bupivacaine(PF) (MARCAINE) 0.5 % injection 30 mL (30 mLs Infiltration Given 12/10/22 0841)  oxyCODONE-acetaminophen (PERCOCET/ROXICET) 5-325 MG per tablet 1 tablet (1 tablet Oral Given 12/10/22 0841)     IMPRESSION / MDM / ASSESSMENT AND PLAN / ED COURSE  I reviewed the triage  vital signs and the nursing notes.                              Differential diagnosis includes, but is not limited to, felon, paronychia, herpetic whitlow, cellulitis, flexor tenosynovitis  Presenting to the ER for evaluation of symptoms and exam findings consistent with felon.  I&D performed as above.   Clinical Course as of 12/10/22 0931  Mon Dec 10, 2022  [id number] X-ray right finger on my review interpretation without evidence of fracture or dislocation. [PR]    Clinical Course User Index [PR] Merlyn Lot, MD    Patient tolerated I&D.  Will be placed on Keflex.  Stable and appropriate for outpatient follow-up.   FINAL CLINICAL IMPRESSION(S) / ED DIAGNOSES   Final diagnoses:  Felon of finger of right hand     Rx / DC Orders   ED Discharge Orders          Ordered    cephALEXin (KEFLEX) 500 MG capsule  3 times daily        12/10/22 [id number]             Note:  This document was prepared using Dragon voice recognition software and may include unintentional dictation errors.    Merlyn Lot, MD 12/10/22 [id number]

## 2022-12-13 ENCOUNTER — Ambulatory Visit: Payer: Self-pay | Admitting: Obstetrics & Gynecology

## 2023-01-02 ENCOUNTER — Other Ambulatory Visit: Payer: Self-pay

## 2023-01-02 ENCOUNTER — Emergency Department
Admission: EM | Admit: 2023-01-02 | Discharge: 2023-01-02 | Disposition: A | Discharge status: Home / Self Care | Payer: Self-pay | Attending: Emergency Medicine | Admitting: Emergency Medicine

## 2023-01-02 DIAGNOSIS — Z1152 Encounter for screening for COVID-19: Secondary | ICD-10-CM | POA: Insufficient documentation

## 2023-01-02 DIAGNOSIS — A084 Viral intestinal infection, unspecified: Secondary | ICD-10-CM | POA: Insufficient documentation

## 2023-01-02 LAB — CBC
HCT: 43.8 % (ref 36.0–46.0)
Hemoglobin: 13.1 g/dL (ref 12.0–15.0)
MCH: 27.7 pg (ref 26.0–34.0)
MCHC: 29.9 g/dL — ABNORMAL LOW (ref 30.0–36.0)
MCV: 92.6 fL (ref 80.0–100.0)
Platelets: 282 10*3/uL (ref 150–400)
RBC: 4.73 MIL/uL (ref 3.87–5.11)
RDW: 16.3 % — ABNORMAL HIGH (ref 11.5–15.5)
WBC: 7.3 10*3/uL (ref 4.0–10.5)
nRBC: 0 % (ref 0.0–0.2)

## 2023-01-02 LAB — COMPREHENSIVE METABOLIC PANEL
ALT: 10 U/L (ref 0–44)
AST: 18 U/L (ref 15–41)
Albumin: 3.8 g/dL (ref 3.5–5.0)
Alkaline Phosphatase: 103 U/L (ref 38–126)
Anion gap: 8 (ref 5–15)
BUN: 11 mg/dL (ref 6–20)
CO2: 20 mmol/L — ABNORMAL LOW (ref 22–32)
Calcium: 8.7 mg/dL — ABNORMAL LOW (ref 8.9–10.3)
Chloride: 109 mmol/L (ref 98–111)
Creatinine, Ser: 0.72 mg/dL (ref 0.44–1.00)
GFR, Estimated: >60 mL/min (ref >60)
Glucose, Bld: 126 mg/dL — ABNORMAL HIGH (ref 70–99)
Potassium: 3.4 mmol/L — ABNORMAL LOW (ref 3.5–5.1)
Sodium: 137 mmol/L (ref 135–145)
Total Bilirubin: 0.7 mg/dL (ref 0.3–1.2)
Total Protein: 7.8 g/dL (ref 6.5–8.1)

## 2023-01-02 LAB — RESP PANEL BY RT-PCR (RSV, FLU A&B, COVID)  RVPGX2
Influenza A by PCR: NEGATIVE
Influenza B by PCR: NEGATIVE
Resp Syncytial Virus by PCR: NEGATIVE
SARS Coronavirus 2 by RT PCR: NEGATIVE

## 2023-01-02 MED ORDER — ONDANSETRON 4 MG PO TBDP
4.0000 mg | ORAL_TABLET | Freq: Once | ORAL | Status: AC | PRN
  Administered 2023-01-02: 4 mg via ORAL

## 2023-01-02 MED ORDER — ONDANSETRON 4 MG PO TBDP: 4.0000 mg | ORAL_TABLET | Freq: Three times a day (TID) | ORAL | 0 refills | Status: DC | PRN

## 2023-01-02 MED ORDER — ONDANSETRON 4 MG PO TBDP
ORAL_TABLET | ORAL | Status: AC
  Filled 2023-01-02: qty 1

## 2023-01-02 NOTE — ED Provider Notes (Signed)
[facility] Provider Note    Event Date/Time   First MD Initiated Contact with Patient 01/02/23 1057     (approximate)   History   Vomiting and Diarrhea   HPI  Jamie Ryan is a 27 y.o. female   presents to the ED with complaint of nausea and vomiting that began this morning.  Patient also reports some diarrhea but denies any vomiting or diarrhea while in the emergency department.  No other family members are sick at this time.      Physical Exam   Triage Vital Signs: ED Triage Vitals  Enc Vitals Group     BP 01/02/23 0956 (!) 139/100     Pulse Rate 01/02/23 0956 67     Resp 01/02/23 0956 20     Temp 01/02/23 0956 (!) 97.5 F (36.4 C)     Temp Source 01/02/23 0956 Oral     SpO2 01/02/23 0956 100 %     Weight 01/02/23 0955 125 lb 10.6 oz (57 kg)     Height 01/02/23 0955 5' (1.524 m)     Head Circumference --      Peak Flow --      Pain Score --      Pain Loc --      Pain Edu? --      Excl. in [place]? --     Most recent vital signs: Vitals:   01/02/23 0956 01/02/23 1253  BP: (!) 139/100 (!) 132/92  Pulse: 67   Resp: 20   Temp: (!) 97.5 F (36.4 C)   SpO2: 100%      General: Awake, no distress.  CV:  Good peripheral perfusion.  Resp:  Normal effort.  Lungs are clear bilaterally. Abd:  No distention.  Soft, nontender, bowel sounds are normoactive x 4 quadrants at this time. Other:     ED Results / Procedures / Treatments   Labs (all labs ordered are listed, but only abnormal results are displayed) Labs Reviewed  COMPREHENSIVE METABOLIC PANEL - Abnormal; Notable for the following components:      Result Value   Potassium 3.4 (*)    CO2 20 (*)    Glucose, Bld 126 (*)    Calcium 8.7 (*)    All other components within normal limits  CBC - Abnormal; Notable for the following components:   MCHC 29.9 (*)    RDW 16.3 (*)    All other components within normal limits  RESP PANEL BY RT-PCR (RSV, FLU A&B, COVID)  RVPGX2   URINALYSIS, ROUTINE W REFLEX MICROSCOPIC  POC URINE PREG, ED     PROCEDURES:  Critical Care performed:   Procedures   MEDICATIONS ORDERED IN ED: Medications  ondansetron (ZOFRAN-ODT) disintegrating tablet 4 mg (4 mg Oral Given 01/02/23 1000)     IMPRESSION / MDM / ASSESSMENT AND PLAN / ED COURSE  I reviewed the triage vital signs and the nursing notes.   Differential diagnosis includes, but is not limited to, viral gastroenteritis, COVID, influenza.  27 year old female presents to the ED with complaint of sudden onset vomiting and diarrhea that started this morning.  Patient was given Zofran while in the ED and during her ED stay did not have any continued vomiting or diarrhea.  We discussed her test results and she was also made aware that her nasal swab had not resulted.  She is very familiar with MyChart and states that she can look at the results for this.  We discussed  that there would not be any medication if she did test positive for COVID or influenza and that the instructions would be the same.      Patient's presentation is most consistent with acute complicated illness / injury requiring diagnostic workup.  FINAL CLINICAL IMPRESSION(S) / ED DIAGNOSES   Final diagnoses:  Viral gastroenteritis     Rx / DC Orders   ED Discharge Orders          Ordered    ondansetron (ZOFRAN-ODT) 4 MG disintegrating tablet  Every 8 hours PRN        01/02/23 1247             Note:  This document was prepared using Dragon voice recognition software and may include unintentional dictation errors.   Johnn Hai, PA-C 01/02/23 1322    Carrie Mew, MD 01/02/23 [phone]

## 2023-01-02 NOTE — Discharge Instructions (Signed)
Follow-up with your primary care provider if any continued problems or concerns.  Drink lots of fluids to stay hydrated, this includes Sprite, ginger ale, chicken broth and popsicles..  No solid food until your diarrhea is under control.  A prescription for Zofran was sent to the pharmacy to take as needed for nausea and vomiting.  Tylenol if needed for body aches.  This virus is contagious and other members of your family may also have similar symptoms.

## 2023-01-02 NOTE — ED Triage Notes (Signed)
Pt states nausea and vomiting th at began suddenly this am. Pt denies abd pain but states has had chills.

## 2023-08-21 ENCOUNTER — Ambulatory Visit: Payer: Self-pay

## 2023-10-17 ENCOUNTER — Ambulatory Visit: Payer: Self-pay

## 2023-10-27 ENCOUNTER — Emergency Department
Admission: EM | Admit: 2023-10-27 | Discharge: 2023-10-27 | Discharge status: Against Medical Advice | Payer: Self-pay | Attending: Physician Assistant | Admitting: Physician Assistant

## 2023-10-27 DIAGNOSIS — R350 Frequency of micturition: Secondary | ICD-10-CM | POA: Insufficient documentation

## 2023-10-27 DIAGNOSIS — Z5321 Procedure and treatment not carried out due to patient leaving prior to being seen by health care provider: Secondary | ICD-10-CM | POA: Insufficient documentation

## 2023-10-27 DIAGNOSIS — N898 Other specified noninflammatory disorders of vagina: Secondary | ICD-10-CM | POA: Insufficient documentation

## 2023-10-27 DIAGNOSIS — R110 Nausea: Secondary | ICD-10-CM | POA: Insufficient documentation

## 2023-10-27 LAB — POC URINE PREG, ED: Preg Test, Ur: NEGATIVE

## 2023-10-27 LAB — URINALYSIS, ROUTINE W REFLEX MICROSCOPIC
Bacteria, UA: NONE SEEN
Bilirubin Urine: NEGATIVE
Glucose, UA: NEGATIVE mg/dL
Ketones, ur: NEGATIVE mg/dL
Leukocytes,Ua: NEGATIVE
Nitrite: NEGATIVE
Protein, ur: NEGATIVE mg/dL
Specific Gravity, Urine: 1.02 (ref 1.005–1.030)
pH: 7 (ref 5.0–8.0)

## 2023-10-27 NOTE — ED Notes (Signed)
Called x2

## 2023-10-27 NOTE — ED Notes (Signed)
Called x1

## 2023-10-27 NOTE — ED Notes (Signed)
Called x3

## 2023-10-27 NOTE — ED Notes (Addendum)
Pt called multiple times for room, called [place] to ensure pt was not in that department. Called and states that she is not in their department.

## 2023-10-27 NOTE — ED Provider Triage Note (Signed)
Emergency Medicine Provider Triage Evaluation Note  Jamie Ryan , a 28 y.o. female  was evaluated in triage.  Pt complains of pelvic cramping, on her period right now, some vaginal discharge, history of problems with her ovaries.  Review of Systems  Positive:  Negative:   Physical Exam  There were no vitals taken for this visit. Gen:   Awake, no distress   Resp:  Normal effort  MSK:   Moves extremities without difficulty  Other:    Medical Decision Making  Medically screening exam initiated at 1:10 PM.  Appropriate orders placed.  Jamie Ryan was informed that the remainder of the evaluation will be completed by another provider, this initial triage assessment does not replace that evaluation, and the importance of remaining in the ED until their evaluation is complete.     Jamie Ghee, PA-C 10/27/23 1311

## 2023-10-27 NOTE — ED Triage Notes (Signed)
Pt c/o lower abdominal cramping and nausea x1 day and frequent urination and vaginal discharge xseveral days.  Pain score 3/10.  Hx of ovarian cysts.  Pt is currently on her menstrual cycle.

## 2023-10-28 ENCOUNTER — Ambulatory Visit: Payer: Self-pay

## 2023-10-28 LAB — CHLAMYDIA/NGC RT PCR (ARMC ONLY)
Chlamydia Tr: NOT DETECTED
N gonorrhoeae: NOT DETECTED

## 2023-12-28 ENCOUNTER — Emergency Department
Admission: EM | Admit: 2023-12-28 | Discharge: 2023-12-28 | Disposition: A | Discharge status: Home / Self Care | Payer: Self-pay | Attending: Emergency Medicine | Admitting: Emergency Medicine

## 2023-12-28 ENCOUNTER — Other Ambulatory Visit: Payer: Self-pay

## 2023-12-28 ENCOUNTER — Emergency Department: Payer: Self-pay

## 2023-12-28 DIAGNOSIS — N83201 Unspecified ovarian cyst, right side: Secondary | ICD-10-CM | POA: Insufficient documentation

## 2023-12-28 DIAGNOSIS — R1031 Right lower quadrant pain: Secondary | ICD-10-CM | POA: Diagnosis present

## 2023-12-28 DIAGNOSIS — R590 Localized enlarged lymph nodes: Secondary | ICD-10-CM | POA: Insufficient documentation

## 2023-12-28 LAB — CBC
HCT: 32.1 % — ABNORMAL LOW (ref 36.0–46.0)
Hemoglobin: 10 g/dL — ABNORMAL LOW (ref 12.0–15.0)
MCH: 22.9 pg — ABNORMAL LOW (ref 26.0–34.0)
MCHC: 31.2 g/dL (ref 30.0–36.0)
MCV: 73.5 fL — ABNORMAL LOW (ref 80.0–100.0)
Platelets: 415 10*3/uL — ABNORMAL HIGH (ref 150–400)
RBC: 4.37 MIL/uL (ref 3.87–5.11)
RDW: 17.1 % — ABNORMAL HIGH (ref 11.5–15.5)
WBC: 7 10*3/uL (ref 4.0–10.5)
nRBC: 0 % (ref 0.0–0.2)

## 2023-12-28 LAB — URINALYSIS, ROUTINE W REFLEX MICROSCOPIC
Glucose, UA: NEGATIVE mg/dL
Hgb urine dipstick: NEGATIVE
Ketones, ur: 5 mg/dL — AB
Nitrite: NEGATIVE
Protein, ur: 100 mg/dL — AB
Specific Gravity, Urine: 1.036 — ABNORMAL HIGH (ref 1.005–1.030)
pH: 5 (ref 5.0–8.0)

## 2023-12-28 LAB — COMPREHENSIVE METABOLIC PANEL WITH GFR
ALT: 12 U/L (ref 0–44)
AST: 20 U/L (ref 15–41)
Albumin: 3.6 g/dL (ref 3.5–5.0)
Alkaline Phosphatase: 74 U/L (ref 38–126)
Anion gap: 9 (ref 5–15)
BUN: 15 mg/dL (ref 6–20)
CO2: 25 mmol/L (ref 22–32)
Calcium: 9.1 mg/dL (ref 8.9–10.3)
Chloride: 103 mmol/L (ref 98–111)
Creatinine, Ser: 0.91 mg/dL (ref 0.44–1.00)
GFR, Estimated: >60 mL/min (ref >60)
Glucose, Bld: 122 mg/dL — ABNORMAL HIGH (ref 70–99)
Potassium: 4.3 mmol/L (ref 3.5–5.1)
Sodium: 137 mmol/L (ref 135–145)
Total Bilirubin: 0.6 mg/dL (ref 0.0–1.2)
Total Protein: 7.9 g/dL (ref 6.5–8.1)

## 2023-12-28 LAB — POC URINE PREG, ED: Preg Test, Ur: NEGATIVE

## 2023-12-28 LAB — LIPASE, BLOOD: Lipase: 24 U/L (ref 11–51)

## 2023-12-28 NOTE — Discharge Instructions (Signed)
 The lymph nodes on your neck are likely related to a viral syndrome and should resolve within the next few days.  If not, please follow-up with your primary care provider.  The ultrasound shows that you have a cyst on the right ovary.  Please call and schedule follow-up appointment with gynecology for repeat ultrasound in about 6 weeks to make sure it has gone away.  See them sooner or return to the ER if your pain gets worse.

## 2023-12-28 NOTE — ED Provider Notes (Signed)
 [facility] Provider Note    Event Date/Time   First MD Initiated Contact with Patient 12/28/23 1814     (approximate)   History   Abdominal Pain   HPI  TARALYN FERRAIOLO is a 28 y.o. female with history of left ovarian cystectomy, PID and as listed in EMR presents to the emergency department for treatment and evaluation of lumps in her neck for the past 2 weeks and right lower quadrant pain for the past 2 or 3 weeks as well.  She denies dysuria, vaginal discharge, nausea, vomiting, cough, body aches, or fever.     Physical Exam   Triage Vital Signs: ED Triage Vitals  Encounter Vitals Group     BP 12/28/23 1654 123/79     Systolic BP Percentile --      Diastolic BP Percentile --      Pulse Rate 12/28/23 1654 90     Resp 12/28/23 1654 18     Temp 12/28/23 1654 98.7 F (37.1 C)     Temp Source 12/28/23 1654 Oral     SpO2 12/28/23 1654 98 %     Weight --      Height 12/28/23 1655 5' (1.524 m)     Head Circumference --      Peak Flow --      Pain Score 12/28/23 1654 5     Pain Loc --      Pain Education --      Exclude from Growth Chart --     Most recent vital signs: Vitals:   12/28/23 1654 12/28/23 2110  BP: 123/79 124/76  Pulse: 90 89  Resp: 18 17  Temp: 98.7 F (37.1 C) 98.7 F (37.1 C)  SpO2: 98% 98%    General: Awake, no distress.  CV:  Good peripheral perfusion.  Resp:  Normal effort.  Abd:  No distention.  No rebound tenderness or guarding.  Soft Other:  Anterior cervical adenopathy palpable bilaterally.  Nontender.   ED Results / Procedures / Treatments   Labs (all labs ordered are listed, but only abnormal results are displayed) Labs Reviewed  COMPREHENSIVE METABOLIC PANEL WITH GFR - Abnormal; Notable for the following components:      Result Value   Glucose, Bld 122 (*)    All other components within normal limits  CBC - Abnormal; Notable for the following components:   Hemoglobin 10.0 (*)    HCT 32.1 (*)     MCV 73.5 (*)    MCH 22.9 (*)    RDW 17.1 (*)    Platelets 415 (*)    All other components within normal limits  URINALYSIS, ROUTINE W REFLEX MICROSCOPIC - Abnormal; Notable for the following components:   Color, Urine AMBER (*)    APPearance HAZY (*)    Specific Gravity, Urine 1.036 (*)    Bilirubin Urine SMALL (*)    Ketones, ur 5 (*)    Protein, ur 100 (*)    Leukocytes,Ua TRACE (*)    Bacteria, UA RARE (*)    All other components within normal limits  POC URINE PREG, ED - Normal  LIPASE, BLOOD     EKG  Not indicated   RADIOLOGY  Image and radiology report reviewed and interpreted by me. Radiology report consistent with the same.  Pelvic ultrasound shows 3.3 cm right ovarian hemorrhagic cyst and a 4.3 cm adjacent paraovarian cyst  PROCEDURES:  Critical Care performed: No  Procedures   MEDICATIONS ORDERED IN ED:  Medications - No data to display   IMPRESSION / MDM / ASSESSMENT AND PLAN / ED COURSE   I have reviewed the triage note.  Differential diagnosis includes, but is not limited to, infectious/inflammatory lymphadenopathy, HIV, syphilis. Ovarian cyst, appendicitis, ectopic pregnancy.  Patient's presentation is most consistent with acute illness / injury with system symptoms.  28 year old female presents to the ER for "knots" in her neck and right "ovary pain" for the past couple of weeks. See HPI.   On exam, she does have slightly enlarged, nontender lymph nodes in the anterior cervical chain.  She is also concerned that there may be something wrong with her right ovary because she has had pain in that area for the past couple of weeks as well.  She reports that her left ovary had to be removed and pain was similar.  CBC shows a mild anemia with a hemoglobin of 10.0 and hematocrit of 32.1.  This is new in comparison to a year ago.  She is asymptomatic.  CMP shows a nonfasting glucose of 122 but otherwise reassuring.  Lipase is normal.  Pregnancy test is  negative.  Urinalysis shows rare bacteria, trace leukocytes, increased protein and ketones, and bilirubin.  She denies urinary symptoms.   Ultrasound is consistent with right ovarian cysts.   Plan will be to have her follow-up with gynecology.  ER return precautions discussed.        FINAL CLINICAL IMPRESSION(S) / ED DIAGNOSES   Final diagnoses:  Cyst of right ovary  Adenopathy, cervical     Rx / DC Orders   ED Discharge Orders     None        Note:  This document was prepared using Dragon voice recognition software and may include unintentional dictation errors.   Chinita Pester, FNP 12/29/23 2322    Janith Lima, MD 12/30/23 [phone]

## 2023-12-28 NOTE — ED Triage Notes (Signed)
 Pt to ed from home via POV for "lumps on my next x 2 weeks and abd pain as well". Pt is cao4, in no acute distress and ambulatory in triage.

## 2024-01-26 ENCOUNTER — Encounter: Payer: Self-pay | Admitting: Psychiatric/Mental Health

## 2024-01-26 ENCOUNTER — Other Ambulatory Visit: Payer: Self-pay

## 2024-01-26 ENCOUNTER — Encounter: Payer: Self-pay | Admitting: Emergency Medicine

## 2024-01-26 ENCOUNTER — Inpatient Hospital Stay
Admission: EM | Admit: 2024-01-26 | Discharge: 2024-01-29 | DRG: 885 | Disposition: A | Discharge status: Home / Self Care | Source: Ambulatory Visit | Attending: Psychiatry | Admitting: Psychiatry

## 2024-01-26 ENCOUNTER — Emergency Department
Admission: EM | Admit: 2024-01-26 | Discharge: 2024-01-26 | Disposition: A | Discharge status: Other Acute Inpatient Hospital | Attending: Emergency Medicine | Admitting: Emergency Medicine

## 2024-01-26 DIAGNOSIS — F332 Major depressive disorder, recurrent severe without psychotic features: Secondary | ICD-10-CM

## 2024-01-26 DIAGNOSIS — F419 Anxiety disorder, unspecified: Secondary | ICD-10-CM | POA: Diagnosis present

## 2024-01-26 DIAGNOSIS — F1420 Cocaine dependence, uncomplicated: Secondary | ICD-10-CM | POA: Diagnosis present

## 2024-01-26 DIAGNOSIS — R45851 Suicidal ideations: Secondary | ICD-10-CM | POA: Insufficient documentation

## 2024-01-26 DIAGNOSIS — Z833 Family history of diabetes mellitus: Secondary | ICD-10-CM

## 2024-01-26 DIAGNOSIS — F1410 Cocaine abuse, uncomplicated: Secondary | ICD-10-CM | POA: Diagnosis present

## 2024-01-26 DIAGNOSIS — F17290 Nicotine dependence, other tobacco product, uncomplicated: Secondary | ICD-10-CM | POA: Diagnosis present

## 2024-01-26 DIAGNOSIS — F331 Major depressive disorder, recurrent, moderate: Secondary | ICD-10-CM

## 2024-01-26 DIAGNOSIS — F330 Major depressive disorder, recurrent, mild: Principal | ICD-10-CM | POA: Diagnosis present

## 2024-01-26 DIAGNOSIS — Z5986 Financial insecurity: Secondary | ICD-10-CM

## 2024-01-26 DIAGNOSIS — Z716 Tobacco abuse counseling: Secondary | ICD-10-CM | POA: Diagnosis not present

## 2024-01-26 DIAGNOSIS — Z818 Family history of other mental and behavioral disorders: Secondary | ICD-10-CM | POA: Diagnosis not present

## 2024-01-26 DIAGNOSIS — F17210 Nicotine dependence, cigarettes, uncomplicated: Secondary | ICD-10-CM | POA: Diagnosis present

## 2024-01-26 DIAGNOSIS — G4700 Insomnia, unspecified: Secondary | ICD-10-CM | POA: Diagnosis present

## 2024-01-26 DIAGNOSIS — Z560 Unemployment, unspecified: Secondary | ICD-10-CM

## 2024-01-26 DIAGNOSIS — F1010 Alcohol abuse, uncomplicated: Secondary | ICD-10-CM | POA: Insufficient documentation

## 2024-01-26 DIAGNOSIS — Z886 Allergy status to analgesic agent status: Secondary | ICD-10-CM

## 2024-01-26 DIAGNOSIS — Z9152 Personal history of nonsuicidal self-harm: Secondary | ICD-10-CM | POA: Diagnosis not present

## 2024-01-26 DIAGNOSIS — Z5900 Homelessness unspecified: Secondary | ICD-10-CM

## 2024-01-26 DIAGNOSIS — Z90721 Acquired absence of ovaries, unilateral: Secondary | ICD-10-CM

## 2024-01-26 DIAGNOSIS — F329 Major depressive disorder, single episode, unspecified: Principal | ICD-10-CM | POA: Diagnosis present

## 2024-01-26 DIAGNOSIS — Z5982 Transportation insecurity: Secondary | ICD-10-CM | POA: Diagnosis not present

## 2024-01-26 DIAGNOSIS — Z9151 Personal history of suicidal behavior: Secondary | ICD-10-CM | POA: Diagnosis not present

## 2024-01-26 LAB — COMPREHENSIVE METABOLIC PANEL WITH GFR
ALT: 16 U/L (ref 0–44)
AST: 23 U/L (ref 15–41)
Albumin: 3.6 g/dL (ref 3.5–5.0)
Alkaline Phosphatase: 80 U/L (ref 38–126)
Anion gap: 8 (ref 5–15)
BUN: 10 mg/dL (ref 6–20)
CO2: 24 mmol/L (ref 22–32)
Calcium: 8.9 mg/dL (ref 8.9–10.3)
Chloride: 101 mmol/L (ref 98–111)
Creatinine, Ser: 0.73 mg/dL (ref 0.44–1.00)
GFR, Estimated: >60 mL/min (ref >60)
Glucose, Bld: 102 mg/dL — ABNORMAL HIGH (ref 70–99)
Potassium: 4.1 mmol/L (ref 3.5–5.1)
Sodium: 133 mmol/L — ABNORMAL LOW (ref 135–145)
Total Bilirubin: 1.1 mg/dL (ref 0.0–1.2)
Total Protein: 8.1 g/dL (ref 6.5–8.1)

## 2024-01-26 LAB — URINE DRUG SCREEN, QUALITATIVE (ARMC ONLY)
Amphetamines, Ur Screen: NOT DETECTED
Barbiturates, Ur Screen: NOT DETECTED
Benzodiazepine, Ur Scrn: NOT DETECTED
Cannabinoid 50 Ng, Ur ~~LOC~~: POSITIVE — AB
Cocaine Metabolite,Ur ~~LOC~~: POSITIVE — AB
MDMA (Ecstasy)Ur Screen: NOT DETECTED
Methadone Scn, Ur: NOT DETECTED
Opiate, Ur Screen: NOT DETECTED
Phencyclidine (PCP) Ur S: NOT DETECTED
Tricyclic, Ur Screen: NOT DETECTED

## 2024-01-26 LAB — CBC
HCT: 35.6 % — ABNORMAL LOW (ref 36.0–46.0)
Hemoglobin: 11 g/dL — ABNORMAL LOW (ref 12.0–15.0)
MCH: 23.5 pg — ABNORMAL LOW (ref 26.0–34.0)
MCHC: 30.9 g/dL (ref 30.0–36.0)
MCV: 76.1 fL — ABNORMAL LOW (ref 80.0–100.0)
Platelets: 383 10*3/uL (ref 150–400)
RBC: 4.68 MIL/uL (ref 3.87–5.11)
RDW: 19.6 % — ABNORMAL HIGH (ref 11.5–15.5)
WBC: 8.3 10*3/uL (ref 4.0–10.5)
nRBC: 0 % (ref 0.0–0.2)

## 2024-01-26 LAB — SALICYLATE LEVEL: Salicylate Lvl: <7 mg/dL — ABNORMAL LOW (ref 7.0–30.0)

## 2024-01-26 LAB — LIPID PANEL
Cholesterol: 155 mg/dL (ref 0–200)
HDL: 45 mg/dL (ref >40)
LDL Cholesterol: 99 mg/dL (ref 0–99)
Total CHOL/HDL Ratio: 3.4 ratio
Triglycerides: 56 mg/dL (ref <150)
VLDL: 11 mg/dL (ref 0–40)

## 2024-01-26 LAB — POC URINE PREG, ED: Preg Test, Ur: NEGATIVE

## 2024-01-26 LAB — ACETAMINOPHEN LEVEL: Acetaminophen (Tylenol), Serum: <10 ug/mL — ABNORMAL LOW (ref 10–30)

## 2024-01-26 LAB — HEMOGLOBIN A1C
Hgb A1c MFr Bld: 4.8 % (ref 4.8–5.6)
Mean Plasma Glucose: 91.06 mg/dL

## 2024-01-26 LAB — HIV ANTIBODY (ROUTINE TESTING W REFLEX): HIV Screen 4th Generation wRfx: NONREACTIVE

## 2024-01-26 LAB — TSH: TSH: 1.423 u[IU]/mL (ref 0.350–4.500)

## 2024-01-26 LAB — ETHANOL: Alcohol, Ethyl (B): <15 mg/dL (ref <15)

## 2024-01-26 MED ORDER — DIPHENHYDRAMINE HCL 50 MG/ML IJ SOLN: 50.0000 mg | Freq: Three times a day (TID) | INTRAMUSCULAR | Status: DC | PRN

## 2024-01-26 MED ORDER — NICOTINE 14 MG/24HR TD PT24
14.0000 mg | MEDICATED_PATCH | Freq: Every day | TRANSDERMAL | Status: DC
  Administered 2024-01-26 – 2024-01-29 (×5): 14 mg via TRANSDERMAL
  Filled 2024-01-26 (×5): qty 1

## 2024-01-26 MED ORDER — LAMOTRIGINE 25 MG PO TABS
25.0000 mg | ORAL_TABLET | Freq: Every day | ORAL | Status: DC
  Administered 2024-01-26 – 2024-01-29 (×4): 25 mg via ORAL
  Filled 2024-01-26 (×4): qty 1

## 2024-01-26 MED ORDER — DIPHENHYDRAMINE HCL 25 MG PO CAPS: 50.0000 mg | ORAL_CAPSULE | Freq: Three times a day (TID) | ORAL | Status: DC | PRN

## 2024-01-26 MED ORDER — HALOPERIDOL LACTATE 5 MG/ML IJ SOLN: 10.0000 mg | Freq: Three times a day (TID) | INTRAMUSCULAR | Status: DC | PRN

## 2024-01-26 MED ORDER — THIAMINE MONONITRATE 100 MG PO TABS
100.0000 mg | ORAL_TABLET | Freq: Every day | ORAL | Status: DC
  Administered 2024-01-26: 100 mg via ORAL
  Filled 2024-01-26: qty 1

## 2024-01-26 MED ORDER — LORAZEPAM 1 MG PO TABS: 1.0000 mg | ORAL_TABLET | ORAL | Status: DC | PRN

## 2024-01-26 MED ORDER — ALUM & MAG HYDROXIDE-SIMETH 200-200-20 MG/5ML PO SUSP: 30.0000 mL | ORAL | Status: DC | PRN

## 2024-01-26 MED ORDER — TRAZODONE HCL 50 MG PO TABS: 50.0000 mg | ORAL_TABLET | Freq: Every evening | ORAL | Status: DC | PRN

## 2024-01-26 MED ORDER — HYDROXYZINE HCL 25 MG PO TABS
25.0000 mg | ORAL_TABLET | Freq: Four times a day (QID) | ORAL | Status: DC | PRN
  Administered 2024-01-27: 25 mg via ORAL
  Filled 2024-01-26: qty 1

## 2024-01-26 MED ORDER — HALOPERIDOL 5 MG PO TABS: 5.0000 mg | ORAL_TABLET | Freq: Three times a day (TID) | ORAL | Status: DC | PRN

## 2024-01-26 MED ORDER — LORAZEPAM 2 MG/ML IJ SOLN: 2.0000 mg | Freq: Three times a day (TID) | INTRAMUSCULAR | Status: DC | PRN

## 2024-01-26 MED ORDER — ACETAMINOPHEN 325 MG PO TABS
650.0000 mg | ORAL_TABLET | Freq: Four times a day (QID) | ORAL | Status: DC | PRN
  Administered 2024-01-26 – 2024-01-27 (×2): 650 mg via ORAL
  Filled 2024-01-26 (×2): qty 2

## 2024-01-26 MED ORDER — LORAZEPAM 2 MG/ML IJ SOLN
2.0000 mg | Freq: Three times a day (TID) | INTRAMUSCULAR | Status: DC | PRN
Start: 2024-01-26 — End: 2024-01-29

## 2024-01-26 MED ORDER — ADULT MULTIVITAMIN W/MINERALS CH
1.0000 | ORAL_TABLET | Freq: Every day | ORAL | Status: DC
  Administered 2024-01-26: 1 via ORAL
  Filled 2024-01-26: qty 1

## 2024-01-26 MED ORDER — FOLIC ACID 1 MG PO TABS
1.0000 mg | ORAL_TABLET | Freq: Every day | ORAL | Status: DC
  Administered 2024-01-26: 1 mg via ORAL
  Filled 2024-01-26: qty 1

## 2024-01-26 MED ORDER — THIAMINE HCL 100 MG/ML IJ SOLN: 100.0000 mg | Freq: Every day | INTRAMUSCULAR | Status: DC

## 2024-01-26 MED ORDER — HALOPERIDOL LACTATE 5 MG/ML IJ SOLN: 5.0000 mg | Freq: Three times a day (TID) | INTRAMUSCULAR | Status: DC | PRN

## 2024-01-26 MED ORDER — MAGNESIUM HYDROXIDE 400 MG/5ML PO SUSP: 30.0000 mL | Freq: Every day | ORAL | Status: DC | PRN

## 2024-01-26 MED ORDER — MELATONIN 5 MG PO TABS
5.0000 mg | ORAL_TABLET | Freq: Every day | ORAL | Status: DC
Start: 2024-01-26 — End: 2024-01-29
  Administered 2024-01-26 – 2024-01-28 (×3): 5 mg via ORAL
  Filled 2024-01-26 (×3): qty 1

## 2024-01-26 NOTE — ED Notes (Addendum)
 RN in BMU informed of pt requesting tylenol  or motrin  for cramps. Attempted to call pt mom again with no answer.

## 2024-01-26 NOTE — ED Notes (Signed)
 Attempted to call pt mom per her request. No answer at this time. Will update pt.

## 2024-01-26 NOTE — Group Note (Signed)
 Date:  01/26/2024 Time:  9:35 PM  Group Topic/Focus:  Wrap-Up Group:   The focus of this group is to help patients review their daily goal of treatment and discuss progress on daily workbooks.    Participation Level:  Active  Participation Quality:  Appropriate and Attentive  Affect:  Appropriate  Cognitive:  Alert and Appropriate  Insight: Appropriate and Good  Engagement in Group:  Improving  Modes of Intervention:  Clarification, Discussion, Rapport Building, and Support  Additional Comments:     Jamie Ryan 01/26/2024, 9:35 PM

## 2024-01-26 NOTE — ED Provider Notes (Signed)
 [facility] Provider Note    Event Date/Time   First MD Initiated Contact with Patient 01/26/24 [phone]     (approximate)   History   Psychiatric Evaluation   HPI  Jamie Ryan is a 28 y.o. female   Past medical history of no significant past medical history presents to the emerged apartment with depression and suicidality.  She has been feeling very depressed and hopeless lately over the last month or so.  She has been drinking alcohol daily to help try to relieve stress.  About 2 weeks ago she decided to kill herself by taking a handful of Tylenol .  She was not seen by doctor then.  She has not taken any medications or overdose attempts recently and has not engaged in any other form of self-harm.  She is here voluntarily seeking psychiatric help.  She denies any other acute medical complaints.       Physical Exam   Triage Vital Signs: ED Triage Vitals  Encounter Vitals Group     BP 01/26/24 0042 122/68     Systolic BP Percentile --      Diastolic BP Percentile --      Pulse Rate 01/26/24 0042 69     Resp 01/26/24 0042 18     Temp 01/26/24 0042 98.4 F (36.9 C)     Temp Source 01/26/24 0042 Oral     SpO2 01/26/24 0042 100 %     Weight 01/26/24 0040 118 lb (53.5 kg)     Height 01/26/24 0040 5' (1.524 m)     Head Circumference --      Peak Flow --      Pain Score 01/26/24 0040 0     Pain Loc --      Pain Education --      Exclude from Growth Chart --     Most recent vital signs: Vitals:   01/26/24 0042  BP: 122/68  Pulse: 69  Resp: 18  Temp: 98.4 F (36.9 C)  SpO2: 100%    General: Awake, no distress.  CV:  Good peripheral perfusion.  Resp:  Normal effort.  Abd:  No distention.  Other:  She is tearful but interactive and appropriate.  Vital signs reviewed and normal.  She has no signs of head trauma or arm trauma.  She has a soft benign abdominal exam especially in the right upper quadrant.   ED Results / Procedures  / Treatments   Labs (all labs ordered are listed, but only abnormal results are displayed) Labs Reviewed  COMPREHENSIVE METABOLIC PANEL WITH GFR - Abnormal; Notable for the following components:      Result Value   Sodium 133 (*)    Glucose, Bld 102 (*)    All other components within normal limits  SALICYLATE LEVEL - Abnormal; Notable for the following components:   Salicylate Lvl <7.0 (*)    All other components within normal limits  ACETAMINOPHEN  LEVEL - Abnormal; Notable for the following components:   Acetaminophen  (Tylenol ), Serum <10 (*)    All other components within normal limits  CBC - Abnormal; Notable for the following components:   Hemoglobin 11.0 (*)    HCT 35.6 (*)    MCV 76.1 (*)    MCH 23.5 (*)    RDW 19.6 (*)    All other components within normal limits  ETHANOL  URINE DRUG SCREEN, QUALITATIVE ([facility] ONLY)  POC URINE PREG, ED     I ordered and reviewed  the above labs they are notable for cell counts electrolytes unremarkable.  Tylenol  salicylate undetectable.  PROCEDURES:  Critical Care performed: No  Procedures   MEDICATIONS ORDERED IN ED: Medications  LORazepam (ATIVAN) tablet 1-4 mg (has no administration in time range)  thiamine (VITAMIN B1) tablet 100 mg (has no administration in time range)    Or  thiamine (VITAMIN B1) injection 100 mg (has no administration in time range)  folic acid (FOLVITE) tablet 1 mg (has no administration in time range)  multivitamin with minerals tablet 1 tablet (has no administration in time range)     IMPRESSION / MDM / ASSESSMENT AND PLAN / ED COURSE  I reviewed the triage vital signs and the nursing notes.                                Patient's presentation is most consistent with acute presentation with potential threat to life or bodily function.  Differential diagnosis includes, but is not limited to, depression, anxiety, suicidality, substance use, intoxication  MDM:     She has been depressed for  quite some time now feeling hopeless and resorting to drinking alcohol daily.  She does not appear very intoxicated nor in acute withdrawal at this time so I will place on CIWA for close monitoring.  She is here voluntarily but since she did attempt suicide a couple weeks ago by overdosing on Tylenol , albeit what seems to be a small amount, and no recent overdose or self-harm attempts I think she should be IVC and so I placed her IVC.  Check basic labs toxicologic labs which look normal and review of LFTs look normal as well so I doubt the attempt from a couple weeks ago put her in liver failure.  Medically clear for psychiatric evaluation under IVC.       FINAL CLINICAL IMPRESSION(S) / ED DIAGNOSES   Final diagnoses:  Suicidal ideation     Rx / DC Orders   ED Discharge Orders     None        Note:  This document was prepared using Dragon voice recognition software and may include unintentional dictation errors.    Buell Carmin, MD 01/26/24 [phone]

## 2024-01-26 NOTE — ED Notes (Signed)
 EDT [place] provided pt with breakfast tray.

## 2024-01-26 NOTE — Progress Notes (Signed)
 Jamie Ryan is a 28 year old female being admitted to BMU from [facility] ED due to Depression and SI. According to patient, she also took a handful of tylenol  about 2-3 weeks ago and never received medical care.   Patient presents with flat affect and tearful. Patient states she has been feeling sad, depressed, and lonely. Patient currently endorses passive SI but denies HI and A/V/H with no plan or intent. Patient states she lives in [place] with her mother but reports conflict at home and financial struggles as she is currently unemployed with lack of transportation. 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 Patient also states she has recently started drinking more stating "I drink about a beer or 2 every other day." Patient oriented to unit and unit rules. Patient verbalized all understanding. Patient denies any current pain or discomfort. Patient remains cooperative in unit at this time.   BP (!) 71/58 (BP Location: Left Arm)   Pulse 71   Temp 98.4 F (36.9 C) (Oral)   Resp 18   LMP 12/30/2023 (Exact Date)   SpO2 100%

## 2024-01-26 NOTE — ED Notes (Signed)
 Patient tearful upon approach states she has been feeling depressed for a month or so. States she has been feeling like she has no where to go and hopeless. Endorses seeing flashes of light but no other hallucinations. Denies SI/HI. Beverage provided. Denies any other concerns at this time.
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
## 2024-01-26 NOTE — ED Notes (Addendum)
 Report given to RN Zaira, request for EKG and then pt can come

## 2024-01-26 NOTE — ED Notes (Signed)
 IVC/Recommend inpatient
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
## 2024-01-26 NOTE — ED Notes (Signed)
 Pt dressed out into the appropriate BH scrub attire by this RN and Lovett Ruck, NT & [place], [place], belongings include:  White shoes Black socks Yellow anklet  Blue graphic T-shirt Blue jeans Black jacket Red underwear

## 2024-01-26 NOTE — Consult Note (Signed)
 [facility] Psychiatric Consult Initial  Patient Name: .Jamie Ryan  MRN: [id number]  DOB: 06-Dec-1995  Consult Order details:    Mode of Visit: Tele-visit Virtual Statement:TELE PSYCHIATRY ATTESTATION & CONSENT As the provider for this telehealth consult, I attest that I verified the patient's identity using two separate identifiers, introduced myself to the patient, provided my credentials, disclosed my location, and performed this encounter via a HIPAA-compliant, real-time, face-to-face, two-way, interactive audio and video platform and with the full consent and agreement of the patient (or guardian as applicable.) Patient physical location: [facility]. Telehealth provider physical location: home office in state of [place].   Video start time:   Video end time:      Psychiatry Consult Evaluation  Service Date: January 26, 2024 LOS:  LOS: 0 days  Chief Complaint "I've been feeling depressed for a little over a month."  Primary Psychiatric Diagnoses   MDD (major depressive disorder), recurrent episode, moderate (HCC) Substance use disorder Cocaine use disorder  Assessment  The patient presents with symptoms consistent with Major Depressive Disorder, moderate to severe, characterized by persistent hopelessness, worthlessness, tearfulness, social withdrawal, appetite disturbance, and significant insomnia. The patient reports a passive suicidal ideation without current intent or plan, although there is a history of a suicide attempt approximately three weeks ago by ingestion of Tylenol . In addition to mood symptoms, the patient meets criteria for a severe Cocaine Use Disorder given daily use over the past two years, with last use reported as yesterday. There is also evidence of Alcohol Use Disorder, mild to moderate, with increased frequency of beer and liquor consumption. Perceptual disturbances are noted (reports of "seeing messages"), but no frank auditory or visual hallucinations are currently  reported. Differential diagnosis includes primary mood disorder versus substance-induced mood disorder, with consideration for a comorbid personality disorder such as Borderline Personality Disorder or a mood disorder such as Bipolar Disorder, given the patient's symptoms and family history of bipolar disorder. Further evaluation in a controlled setting is necessary to clarify the diagnosis and initiate appropriate treatment.   Diagnoses:  Principal Problem:   MDD (major depressive disorder), recurrent episode, moderate (HCC) Substance use disorder Cocaine use disorder  Plan   Inpatient Psychiatric Hospitalization  For stabilization of mood symptoms, safety monitoring, initiation of pharmacotherapy, and structured environment to address substance use and psychiatric symptoms.  Comprehensive Psychiatric Evaluation  To formally diagnose mood disorder versus substance-induced mood disorder versus comorbid personality disorder.  Substance Use Treatment  Dual diagnosis program to address both mood symptoms and cocaine/alcohol use disorders concurrently.  Medication Initiation (Pending Psychiatric Evaluation)  Start antidepressant (e.g., SSRI) if no contraindications.  Consider mood stabilizer if bipolar disorder confirmed or highly suspected.  Monitor for emergent suicidal ideation, mania, or worsening depression with medication initiation.  Counseling and Psychotherapy  Begin individual therapy focusing on coping skills, relapse prevention, and emotional regulation.  Recommend cognitive behavioral therapy (CBT) and dialectical behavior therapy (DBT) approaches, especially if BPD traits are confirmed.  Toxicology Screening  Comprehensive urine drug screen on admission.  Medical Evaluation  Labs including CBC, CMP, TSH, Vitamin B12, Folate, Hepatitis panel, HIV, and other pertinent medical clearance tests as needed.  Social Work Solicitor with long-term housing  stability, substance use resources, and family support engagement if patient consents.    Thank you for this consult request. Recommendations have been communicated to the primary team.  We will recommend inpatient at this time.   Al Alias, NP  History of Present Illness  Jamie Ryan is a 28 year old female presenting with complaints of depressed mood for a little over one month. The patient reports symptoms of hopelessness, worthlessness, tearfulness, social isolation, decreased appetite, and significant sleep disturbances characterized by staying awake throughout the night. The patient acknowledges a history of substance use, specifically daily smoking of crack cocaine for approximately two years, with the last use occurring yesterday. Patient also reports increased alcohol use recently, typically consisting of beer and occasional liquor shots.  The patient admits to a prior suicide attempt approximately three weeks ago by ingesting Tylenol  pills; however, no medical care was sought at that time. Currently, the patient denies active suicidal ideation but endorses persistent feelings of hopelessness. The patient also describes perceptual disturbances, including "seeing messages," although denies full auditory or visual hallucinations.  There is no formal psychiatric history, although the patient reports a prior mental health assessment suggesting possible Borderline Personality Disorder (BPD) or Bipolar Disorder. Family history is significant for bipolar disorder. The patient has never been on psychiatric medications nor engaged in counseling services.  The patient lives with three brothers but attempts to stay intermittently with their mother due to household stressors. The patient has no children.  Review of Systems  Psychiatric/Behavioral:  Positive for depression, hallucinations and suicidal ideas.   All other systems reviewed and are negative.    Psychiatric and  Social History  Psychiatric History:  Information collected from patient    Social History:  Social History   Tobacco Use   Smoking status: Every Day    Current packs/day: 0.25    Average packs/day: 0.3 packs/day for 1 year (0.3 ttl pk-yrs)    Types: Cigarettes   Smokeless tobacco: Never  Vaping Use   Vaping status: Never Used  Substance Use Topics   Alcohol use: Yes    Comment: occ   Drug use: Not Currently    Types: Marijuana     Exam Findings  Physical Exam:  Vital Signs:  Temp:  [98.4 F (36.9 C)] 98.4 F (36.9 C) (04/27 0042) Pulse Rate:  [69] 69 (04/27 0042) Resp:  [18] 18 (04/27 0042) BP: (122)/(68) 122/68 (04/27 0042) SpO2:  [100 %] 100 % (04/27 0042) Weight:  [53.5 kg] 53.5 kg (04/27 0040) Blood pressure 122/68, pulse 69, temperature 98.4 F (36.9 C), temperature source Oral, resp. rate 18, height 5' (1.524 m), weight 53.5 kg, last menstrual period 12/30/2023, SpO2 100%. Body mass index is 23.05 kg/m.  Physical Exam Vitals and nursing note reviewed.    Mental Status Exam: Appearance Appears stated age, appropriately dressed, fair grooming and hygiene. Behavior Cooperative but slightly guarded. Speech Spontaneous, coherent, normal rate and tone. Mood  "Depressed." Affect  Congruent with mood; tearful at times. Thought Process Linear, goal-directed with ruminative thoughts about hopelessness and worthlessness. Thought Content Passive suicidal ideation without current plan or intent; no overt delusions. Perceptual Disturbances Reports seeing "messages," no overt hallucinations observed during evaluation. Cognition  Grossly intact; mild impairment in attention and concentration. Insight Limited. Judgment  Fair. Psychomotor  No psychomotor agitation or retardation noted.  Assets:  Communication Skills Desire for Improvement Resilience  Cognition:  WNL  ADL's:  Intact  AIMS (if indicated):        Other History   These have been pulled in through  the EMR, reviewed, and updated if appropriate.  Family History:  The patient's family history includes Diabetes in her paternal grandfather.  Medical History: Past Medical History:  Diagnosis Date  Ovarian cyst    PID (pelvic inflammatory disease) 01-2016    Surgical History: Past Surgical History:  Procedure Laterality Date   LAPAROSCOPIC OVARIAN CYSTECTOMY Left 02/14/2016   Procedure: LAPAROSCOPY WITH Left Oophorectomy;  Surgeon: Darl Edu, MD;  Location: [facility] ORS;  Service: Gynecology;  Laterality: Left;   LEFT OOPHORECTOMY Left    OVARIAN CYST REMOVAL       Medications:   Current Facility-Administered Medications:    folic acid (FOLVITE) tablet 1 mg, 1 mg, Oral, Daily, Buell Carmin, MD   LORazepam (ATIVAN) tablet 1-4 mg, 1-4 mg, Oral, Q1H PRN, Buell Carmin, MD   multivitamin with minerals tablet 1 tablet, 1 tablet, Oral, Daily, Buell Carmin, MD   thiamine (VITAMIN B1) tablet 100 mg, 100 mg, Oral, Daily **OR** thiamine (VITAMIN B1) injection 100 mg, 100 mg, Intravenous, Daily, Buell Carmin, MD  Current Outpatient Medications:    ondansetron  (ZOFRAN -ODT) 4 MG disintegrating tablet, Take 1 tablet (4 mg total) by mouth every 8 (eight) hours as needed for nausea or vomiting., Disp: 12 tablet, Rfl: 0  Allergies: Allergies  Allergen Reactions   Aleve [Naproxen] Hives    States tried aleve 2 months in row for period cramping, had hives to both arms with both times    Briell Paulette Cheril Cork, NP

## 2024-01-26 NOTE — BH Assessment (Addendum)
 Patient is to be admitted to [facility] on today 01/26/24 by Dr.  Jadapalle .  Attending Physician will be Dr.  Jadapalle .   Patient has been assigned to room 310, by [facility] Charge Nurse B[place].    ER staff is aware of the admission: Nitchia, ER Secretary   Dr. Drenda Gentle, ER MD  Ova Bloomer, Patient's Nurse

## 2024-01-26 NOTE — Group Note (Signed)
 Date:  01/26/2024 Time:  5:59 PM  Group Topic/Focus:  Goals Group:   The focus of this group is to help patients establish daily goals to achieve during treatment and discuss how the patient can incorporate goal setting into their daily lives to aide in recovery.  Participation Level:  Minimal  Participation Quality:  Appropriate  Affect:  Appropriate  Cognitive:  Appropriate  Insight: Appropriate  Engagement in Group:  Engaged  Modes of Intervention:  Discussion and Education  Additional Comments:    Lauro Manlove A Juana Haralson 01/26/2024, 5:59 PM

## 2024-01-26 NOTE — ED Notes (Signed)
 Attempted to call report, per RN they have not received chat on pt yet and they are calling the [facility] and will call me back. Will Personal assistant.

## 2024-01-26 NOTE — ED Triage Notes (Signed)
 Pt presents to the ED via POV with complaints of depression x 1 month and feeling hopeless. Pt initially SI but withdrawals the statement at this time. Pt wishes to speak with a provider before disclosing more information. Pt is tearful and cooperative in triage. A&Ox4 at this time. Denies CP or SOB.

## 2024-01-26 NOTE — Tx Team (Addendum)
 Initial Treatment Plan 01/26/2024 1:01 PM KATHLEENE BASILONE ZOX:[id number]    PATIENT STRESSORS: Financial difficulties   Marital or family conflict   Substance abuse     PATIENT STRENGTHS: Ability for insight  Motivation for treatment/growth    PATIENT IDENTIFIED PROBLEMS: Depression  Substance abuse                   DISCHARGE CRITERIA:  Improved stabilization in mood, thinking, and/or behavior Verbal commitment to aftercare and medication compliance  PRELIMINARY DISCHARGE PLAN: Return to previous living arrangement  PATIENT/FAMILY INVOLVEMENT: This treatment plan has been presented to and reviewed with the patient, Richetta N Custis.  The patient has been given the opportunity to ask questions and make suggestions.  Cayle Thunder, RN 01/26/2024, 1:01 PM

## 2024-01-26 NOTE — Plan of Care (Signed)
  Problem: Education: Goal: Knowledge of [facility] Education information/materials will improve Outcome: Progressing Goal: Verbalization of understanding the information provided will improve Outcome: Progressing   Problem: Safety: Goal: Periods of time without injury will increase Outcome: Progressing   

## 2024-01-26 NOTE — BH Assessment (Signed)
 Assessment Note  Jamie Ryan is an 28 y.o. female presenting to the ED with symptoms of depression and suicidal ideations.  She has been feeling very depressed and hopeless lately over the last month or so.  She has been drinking alcohol daily and using cocaine to help try to relieve stress.  Patient presents with symptoms consistent with Major Depressive Disorder, moderate to severe, characterized by persistent hopelessness, worthlessness, tearfulness, social withdrawal, appetite disturbance, and significant insomnia. She reports passive suicidal ideations without current intent or plan, although there is a history of a suicide attempt approximately three weeks ago by ingestion of Tylenol . I  Diagnosis: Major Depressive Disorder (MDD), Substance Use Disorder, Cocaine Use Disorder  Past Medical History:  Past Medical History:  Diagnosis Date   Ovarian cyst    PID (pelvic inflammatory disease) 01-2016    Past Surgical History:  Procedure Laterality Date   LAPAROSCOPIC OVARIAN CYSTECTOMY Left 02/14/2016   Procedure: LAPAROSCOPY WITH Left Oophorectomy;  Surgeon: Darl Edu, MD;  Location: [facility] ORS;  Service: Gynecology;  Laterality: Left;   LEFT OOPHORECTOMY Left    OVARIAN CYST REMOVAL      Family History:  Family History  Problem Relation Age of Onset   Diabetes Paternal Grandfather     Social History:  reports that she has been smoking cigarettes. She has a 0.3 pack-year smoking history. She has never used smokeless tobacco. She reports current alcohol use. She reports that she does not currently use drugs after having used the following drugs: Marijuana.  Additional Social History:     CIWA: CIWA-Ar BP: 122/68 Pulse Rate: 69 COWS:    Allergies:  Allergies  Allergen Reactions   Aleve [Naproxen] Hives    States tried aleve 2 months in row for period cramping, had hives to both arms with both times    Home Medications: (Not in a hospital admission)   OB/GYN  Status:  Patient's last menstrual period was 12/30/2023 (exact date).  General Assessment Data Location of Assessment: [facility] ED Admission Status: Voluntary  Mental Status Report Motor Activity: Freedom of movement    Abuse/Neglect Assessment (Assessment to be complete while patient is alone) Physical Abuse: Denies Verbal Abuse: Denies Sexual Abuse: Denies Exploitation of patient/patient's resources: Denies Self-Neglect: Denies     Advance Directives (For Healthcare) Does Patient Have a Medical Advance Directive?: No   Disposition: Patient meets criteria for inpatient hospitalization.    On Site Evaluation by:   Reviewed with Physician:    Zollie Hipp 01/26/2024 4:05 AM

## 2024-01-27 ENCOUNTER — Encounter: Payer: Self-pay | Admitting: Psychiatric/Mental Health

## 2024-01-27 DIAGNOSIS — F330 Major depressive disorder, recurrent, mild: Principal | ICD-10-CM

## 2024-01-27 LAB — RPR
RPR Ser Ql: REACTIVE — AB
RPR Titer: 1:32 {titer}

## 2024-01-27 MED ORDER — IBUPROFEN 200 MG PO TABS
400.0000 mg | ORAL_TABLET | Freq: Four times a day (QID) | ORAL | Status: DC | PRN
  Administered 2024-01-27 – 2024-01-28 (×3): 400 mg via ORAL
  Filled 2024-01-27 (×2): qty 2

## 2024-01-27 MED ORDER — IBUPROFEN 600 MG PO TABS: 600.0000 mg | ORAL_TABLET | Freq: Once | ORAL | Status: DC

## 2024-01-27 NOTE — Group Note (Signed)
 Date:  01/27/2024 Time:  8:50 PM  Group Topic/Focus:  Wrap-Up Group:   The focus of this group is to help patients review their daily goal of treatment and discuss progress on daily workbooks.    Participation Level:  Active  Participation Quality:  Appropriate and Attentive  Affect:  Appropriate  Cognitive:  Alert and Appropriate  Insight: Appropriate  Engagement in Group:  Engaged  Modes of Intervention:  Discussion and Orientation  Additional Comments:     Maglione,Tally Mattox E 01/27/2024, 8:50 PM

## 2024-01-27 NOTE — Progress Notes (Signed)
   01/27/24 1200  Psych Admission Type (Psych Patients Only)  Admission Status Involuntary  Psychosocial Assessment  Patient Complaints Sleep disturbance;Depression;Substance abuse  Eye Contact Fair  Facial Expression Sad  Affect Appropriate to circumstance  Speech Logical/coherent  Interaction Assertive  Motor Activity Slow  Appearance/Hygiene In scrubs  Behavior Characteristics Cooperative  Mood Pleasant;Sad  Thought Process  Coherency WDL  Content WDL  Delusions None reported or observed  Perception WDL  Hallucination None reported or observed  Judgment Impaired  Confusion None  Danger to Self  Current suicidal ideation? Denies  Self-Injurious Behavior No self-injurious ideation or behavior indicators observed or expressed   Agreement Not to Harm Self Yes  Description of Agreement verbal  Danger to Others  Danger to Others None reported or observed

## 2024-01-27 NOTE — Plan of Care (Signed)
  Problem: Education: Goal: Knowledge of [facility] Education information/materials will improve Outcome: Progressing   Problem: Education: Goal: Emotional status will improve Outcome: Progressing   Problem: Education: Goal: Mental status will improve Outcome: Progressing   Problem: Education: Goal: Verbalization of understanding the information provided will improve Outcome: Progressing   Problem: Activity: Goal: Interest or engagement in activities will improve Outcome: Progressing   

## 2024-01-27 NOTE — Group Note (Signed)
 Recreation Therapy Group Note   Group Topic:Healthy Support Systems  Group Date: 01/27/2024 Start Time: 1040 End Time: 1130 Facilitators: Yvonna Herder, CTRS Location:  Craft Room  Group Description: Straw Bridge. In groups or individually, patients were given 10 plastic drinking straws and an equal length of masking tape. Using the materials provided, patients were instructed to build a free-standing bridge-like structure to suspend an everyday item (ex: deck of cards) off the floor or table surface. All materials were required to be used in Secondary school teacher. LRT facilitated post-activity discussion reviewing the importance of having strong and healthy support systems in our lives. LRT discussed how the people in our lives serve as the tape and the deck of cards we placed on top of our straw structure are the stressors we face in daily life. LRT and pts discussed what happens in our life when things get too heavy for us , and we don't have strong supports outside of the hospital. Pt shared 2 of their healthy supports in their life aloud in the group.   Goal Area(s) Addressed:  Patient will identify 2 healthy supports in their life. Patient will identify skills to successfully complete activity. Patient will identify correlation of this activity to life post-discharge.  Patient will build on frustration tolerance skills. Patient will increase team building and communication skills.    Affect/Mood: N/A   Participation Level: Did not attend    Clinical Observations/Individualized Feedback: Patient did not attend group.   Plan: Continue to engage patient in RT group sessions 2-3x/week.   Deatrice Factor, LRT, CTRS 01/27/2024 1:20 PM

## 2024-01-27 NOTE — Progress Notes (Signed)
   01/27/24 1200  Psych Admission Type (Psych Patients Only)  Admission Status Involuntary  Psychosocial Assessment  Patient Complaints Sleep disturbance;Depression;Substance abuse  Eye Contact Fair  Facial Expression Sad  Affect Appropriate to circumstance  Speech Logical/coherent  Interaction Assertive  Motor Activity Slow  Appearance/Hygiene In scrubs  Behavior Characteristics Cooperative  Mood Pleasant;Sad  Thought Process  Coherency WDL  Content WDL  Delusions None reported or observed  Perception WDL  Hallucination None reported or observed  Judgment Impaired  Confusion None  Danger to Self  Current suicidal ideation? Denies  Self-Injurious Behavior No self-injurious ideation or behavior indicators observed or expressed   Agreement Not to Harm Self Yes  Description of Agreement verbal  Danger to Others  Danger to Others None reported or observed   Patient stated that she is doing better today. Had Vistaril x 1 for anxiety. Support and encouragement given.

## 2024-01-27 NOTE — Group Note (Signed)
 Date:  01/27/2024 Time:  10:24 AM  Group Topic/Focus:  Healthy Communication:   The focus of this group is to discuss communication, barriers to communication, as well as healthy ways to communicate with others.    Participation Level:  Active  Participation Quality:  Appropriate  Affect:  Appropriate  Cognitive:  Appropriate  Insight: Appropriate  Engagement in Group:  Engaged  Modes of Intervention:  Activity  Additional Comments:    Keila Turan 01/27/2024, 10:24 AM

## 2024-01-27 NOTE — Progress Notes (Signed)
 Patient is alert and oriented x 4, affect is flat but brightens upon approach. Patient denies SI/HI/AVH, she is visible in the milieu interacting appropriately with peers and staff. Patient rated depression 5/10 ( 0 low - 10 high ). Patient c/o of menstruation pain and was medicated with tylenol .15 minutes safety checks maintained will continue to monitor.

## 2024-01-27 NOTE — BH IP Treatment Plan (Signed)
 Interdisciplinary Treatment and Diagnostic Plan Update  01/27/2024 Time of Session: 10:33 Jamie Ryan MRN: [id number]  Principal Diagnosis: MDD (major depressive disorder)  Secondary Diagnoses: Principal Problem:   MDD (major depressive disorder) Active Problems:   Cocaine use disorder (HCC)   Current Medications:  Current Facility-Administered Medications  Medication Dose Route Frequency Provider Last Rate Last Admin   acetaminophen  (TYLENOL ) tablet 650 mg  650 mg Oral Q6H PRN Tingling, Stephanie, PA-C   650 mg at 01/27/24 0220   alum & mag hydroxide-simeth (MAALOX/MYLANTA) 200-200-20 MG/5ML suspension 30 mL  30 mL Oral Q4H PRN Tingling, Stephanie, PA-C       haloperidol (HALDOL) tablet 5 mg  5 mg Oral TID PRN Tingling, Stephanie, PA-C       And   diphenhydrAMINE (BENADRYL) capsule 50 mg  50 mg Oral TID PRN Tingling, Stephanie, PA-C       haloperidol lactate (HALDOL) injection 10 mg  10 mg Intramuscular TID PRN Tingling, Stephanie, PA-C       And   diphenhydrAMINE (BENADRYL) injection 50 mg  50 mg Intramuscular TID PRN Tingling, Stephanie, PA-C       And   LORazepam (ATIVAN) injection 2 mg  2 mg Intramuscular TID PRN Tingling, Stephanie, PA-C       haloperidol lactate (HALDOL) injection 5 mg  5 mg Intramuscular TID PRN Tingling, Stephanie, PA-C       And   diphenhydrAMINE (BENADRYL) injection 50 mg  50 mg Intramuscular TID PRN Tingling, Stephanie, PA-C       And   LORazepam (ATIVAN) injection 2 mg  2 mg Intramuscular TID PRN Tingling, Stephanie, PA-C       hydrOXYzine (ATARAX) tablet 25 mg  25 mg Oral Q6H PRN Tingling, Stephanie, PA-C       ibuprofen  (ADVIL ) tablet 400 mg  400 mg Oral Q6H PRN Jamie Rosser, Veronique M, NP       lamoTRIgine (LAMICTAL) tablet 25 mg  25 mg Oral Daily Tingling, Stephanie, PA-C   25 mg at 01/27/24 0836   magnesium hydroxide (MILK OF MAGNESIA) suspension 30 mL  30 mL Oral Daily PRN Tingling, Stephanie, PA-C       melatonin tablet 5 mg  5 mg Oral  QHS Tingling, Stephanie, PA-C   5 mg at 01/26/24 2141   nicotine (NICODERM CQ - dosed in mg/24 hours) patch 14 mg  14 mg Transdermal Daily Tingling, Stephanie, PA-C   14 mg at 01/27/24 0836   traZODone (DESYREL) tablet 50 mg  50 mg Oral QHS PRN Tingling, Stephanie, PA-C       PTA Medications: No medications prior to admission.    Patient Stressors: Financial difficulties   Marital or family conflict   Substance abuse    Patient Strengths: Ability for insight  Motivation for treatment/growth   Treatment Modalities: Medication Management, Group therapy, Case management,  1 to 1 session with clinician, Psychoeducation, Recreational therapy.   Physician Treatment Plan for Primary Diagnosis: MDD (major depressive disorder) Long Term Goal(s):     Short Term Goals:    Medication Management: Evaluate patient's response, side effects, and tolerance of medication regimen.  Therapeutic Interventions: 1 to 1 sessions, Unit Group sessions and Medication administration.  Evaluation of Outcomes: Not Met  Physician Treatment Plan for Secondary Diagnosis: Principal Problem:   MDD (major depressive disorder) Active Problems:   Cocaine use disorder (HCC)  Long Term Goal(s):     Short Term Goals:       Medication Management: Evaluate patient's  response, side effects, and tolerance of medication regimen.  Therapeutic Interventions: 1 to 1 sessions, Unit Group sessions and Medication administration.  Evaluation of Outcomes: Not Met   RN Treatment Plan for Primary Diagnosis: MDD (major depressive disorder) Long Term Goal(s): Knowledge of disease and therapeutic regimen to maintain health will improve  Short Term Goals: Ability to remain free from injury will improve, Ability to verbalize frustration and anger appropriately will improve, Ability to demonstrate self-control, Ability to participate in decision making will improve, Ability to verbalize feelings will improve, Ability to disclose  and discuss suicidal ideas, Ability to identify and develop effective coping behaviors will improve, and Compliance with prescribed medications will improve  Medication Management: RN will administer medications as ordered by provider, will assess and evaluate patient's response and provide education to patient for prescribed medication. RN will report any adverse and/or side effects to prescribing provider.  Therapeutic Interventions: 1 on 1 counseling sessions, Psychoeducation, Medication administration, Evaluate responses to treatment, Monitor vital signs and CBGs as ordered, Perform/monitor CIWA, COWS, AIMS and Fall Risk screenings as ordered, Perform wound care treatments as ordered.  Evaluation of Outcomes: Not Met   LCSW Treatment Plan for Primary Diagnosis: MDD (major depressive disorder) Long Term Goal(s): Safe transition to appropriate next level of care at discharge, Engage patient in therapeutic group addressing interpersonal concerns.  Short Term Goals: Engage patient in aftercare planning with referrals and resources, Increase social support, Increase ability to appropriately verbalize feelings, Increase emotional regulation, Facilitate acceptance of mental health diagnosis and concerns, Facilitate patient progression through stages of change regarding substance use diagnoses and concerns, Identify triggers associated with mental health/substance abuse issues, and Increase skills for wellness and recovery  Therapeutic Interventions: Assess for all discharge needs, 1 to 1 time with Social worker, Explore available resources and support systems, Assess for adequacy in community support network, Educate family and significant other(s) on suicide prevention, Complete Psychosocial Assessment, Interpersonal group therapy.  Evaluation of Outcomes: Not Met   Progress in Treatment: Attending groups: Yes. Participating in groups: Yes. Taking medication as prescribed: Yes. Toleration  medication: Yes. Family/Significant other contact made: No, will contact:  mother, Jamie Ryan.  Patient understands diagnosis: Yes. Discussing patient identified problems/goals with staff: Yes. Medical problems stabilized or resolved: Yes. Denies suicidal/homicidal ideation: Yes. Issues/concerns per patient self-inventory: No. Other: none.  New problem(s) identified: No, Describe:  none identified.  New Short Term/Long Term Goal(s): detox, medication management for mood stabilization; elimination of SI thoughts; development of comprehensive mental wellness/sobriety plan.  Patient Goals:  "Just to set up some type of plan so that when I go home I can have some therapy."   Discharge Plan or Barriers: CSW will assist pt with development of an appropriate aftercare/discharge plan.   Reason for Continuation of Hospitalization: Depression Medication stabilization Suicidal ideation  Estimated Length of Stay: 1-7 days  Last 3 [place] Suicide Severity Risk Score: Flowsheet Row Admission (Current) from 01/26/2024 in [facility]INE Most recent reading at 01/26/2024 10:36 AM ED from 01/26/2024 in [facility] Emergency Department [facility] Most recent reading at 01/26/2024 12:41 AM ED from 12/28/2023 in [facility] Emergency Department at [facility] Most recent reading at 12/28/2023  4:57 PM  C-SSRS RISK CATEGORY Low Risk No Risk No Risk       Last PHQ 2/9 Scores:    01/26/2024    4:00 AM  Depression screen PHQ 2/9  Decreased Interest 3  Down, Depressed, Hopeless 3  PHQ - 2  Score 6  Altered sleeping 3  Tired, decreased energy 3  Change in appetite 3  Feeling bad or failure about yourself  3  Trouble concentrating 3  Moving slowly or fidgety/restless 1  PHQ-9 Score 22    Scribe for Treatment Team: Randolm Butte, LCSW 01/27/2024 2:13 PM

## 2024-01-27 NOTE — Group Note (Signed)
 [facility] LCSW Group Therapy Note    Group Date: 01/27/2024 Start Time: 1320 End Time: 1440  Type of Therapy and Topic:  Group Therapy:  Overcoming Obstacles  Participation Level:  [facility] PARTICIPATION LEVEL: Active  Mood:  Description of Group:   In this group patients will be encouraged to explore what they see as obstacles to their own wellness and recovery. They will be guided to discuss their thoughts, feelings, and behaviors related to these obstacles. The group will process together ways to cope with barriers, with attention given to specific choices patients can make. Each patient will be challenged to identify changes they are motivated to make in order to overcome their obstacles. This group will be process-oriented, with patients participating in exploration of their own experiences as well as giving and receiving support and challenge from other group members.  Therapeutic Goals: 1. Patient will identify personal and current obstacles as they relate to admission. 2. Patient will identify barriers that currently interfere with their wellness or overcoming obstacles.  3. Patient will identify feelings, thought process and behaviors related to these barriers. 4. Patient will identify two changes they are willing to make to overcome these obstacles:    Summary of Patient Progress Pt was present in group.  Patient was an active participant in group.  Patient was supportive of others.  Patient showed fair insight.  Patient discussed how her anxiety and depression have been barriers to her mental health.  Patient engaged in discussion on how to overcome this obstacle.     Therapeutic Modalities:   Cognitive Behavioral Therapy Solution Focused Therapy Motivational Interviewing Relapse Prevention Therapy   Larri Ply, LCSW

## 2024-01-27 NOTE — Plan of Care (Signed)
  Problem: Safety: Goal: Periods of time without injury will increase Outcome: Progressing   Problem: Physical Regulation: Goal: Ability to maintain clinical measurements within normal limits will improve Outcome: Progressing   Problem: Coping: Goal: Ability to verbalize frustrations and anger appropriately will improve Outcome: Progressing   Problem: Activity: Goal: Interest or engagement in activities will improve Outcome: Progressing   Problem: Education: Goal: Verbalization of understanding the information provided will improve Outcome: Progressing   Problem: Education: Goal: Emotional status will improve Outcome: Progressing
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
## 2024-01-27 NOTE — Group Note (Signed)
 Date:  01/27/2024 Time:  4:44 PM  Group Topic/Focus:  Activity Group: The focus of the group is to promote activity for the patients and encourage them to go outside in the courtyard and get some fresh air and exercise.    Participation Level:  Active  Participation Quality:  Appropriate  Affect:  Appropriate  Cognitive:  Appropriate  Insight: Appropriate  Engagement in Group:  Engaged  Modes of Intervention:  Activity  Additional Comments:    Marianna Shirk Mckenzee Beem 01/27/2024, 4:44 PM

## 2024-01-28 DIAGNOSIS — F332 Major depressive disorder, recurrent severe without psychotic features: Secondary | ICD-10-CM

## 2024-01-28 LAB — T.PALLIDUM AB, TOTAL: T Pallidum Abs: REACTIVE — AB

## 2024-01-28 NOTE — Plan of Care (Signed)
  Problem: Education: Goal: Emotional status will improve Outcome: Progressing Goal: Mental status will improve Outcome: Progressing Goal: Verbalization of understanding the information provided will improve Outcome: Progressing   Problem: Activity: Goal: Interest or engagement in activities will improve Outcome: Progressing   Problem: Health Behavior/Discharge Planning: Goal: Compliance with treatment plan for underlying cause of condition will improve Outcome: Progressing   Problem: Physical Regulation: Goal: Ability to maintain clinical measurements within normal limits will improve Outcome: Progressing   Problem: Safety: Goal: Periods of time without injury will increase Outcome: Progressing

## 2024-01-28 NOTE — Progress Notes (Signed)
 Patient sleeping. Will administer morning medications when patient awakes. Respirations even and unlabored.
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
## 2024-01-28 NOTE — Plan of Care (Signed)
   Problem: Education: Goal: Emotional status will improve Outcome: Progressing Goal: Mental status will improve Outcome: Progressing

## 2024-01-28 NOTE — Group Note (Signed)
 LCSW Group Therapy Note  Group Date: 01/28/2024 Start Time: 1300 End Time: 1400   Type of Therapy and Topic:  Group Therapy - Healthy vs Unhealthy Coping Skills  Participation Level:  Active   Description of Group The focus of this group was to determine what unhealthy coping techniques typically are used by group members and what healthy coping techniques would be helpful in coping with various problems. Patients were guided in becoming aware of the differences between healthy and unhealthy coping techniques. Patients were asked to identify 2-3 healthy coping skills they would like to learn to use more effectively.  Therapeutic Goals Patients learned that coping is what human beings do all day long to deal with various situations in their lives Patients defined and discussed healthy vs unhealthy coping techniques Patients identified their preferred coping techniques and identified whether these were healthy or unhealthy Patients determined 2-3 healthy coping skills they would like to become more familiar with and use more often. Patients provided support and ideas to each other   Summary of Patient Progress:   During group, Patient expressed candidly. Patient proved open to input from peers and feedback from CSW. Patient demonstrated proficient insight into the subject matter, was respectful of peers, and participated throughout the entire session.   Therapeutic Modalities Cognitive Behavioral Therapy Motivational Interviewing  Valeen Gartner 01/28/2024  1:43 PM

## 2024-01-28 NOTE — Progress Notes (Signed)
   01/28/24 1100  Psych Admission Type (Psych Patients Only)  Admission Status Involuntary  Psychosocial Assessment  Patient Complaints None  Eye Contact Fair  Facial Expression Animated  Affect Sad  Speech Logical/coherent  Interaction Assertive  Motor Activity Slow  Appearance/Hygiene Unremarkable  Behavior Characteristics Cooperative  Mood Pleasant (Patient reports a goal for today is "emotional regulation" and writes she will practice "breathing skills" to help her reach that goal.)  Thought Process  Coherency WDL  Content WDL  Delusions None reported or observed  Perception WDL  Hallucination None reported or observed  Judgment Impaired  Confusion None  Danger to Self  Current suicidal ideation? Denies  Self-Injurious Behavior No self-injurious ideation or behavior indicators observed or expressed   Danger to Others  Danger to Others None reported or observed

## 2024-01-28 NOTE — Group Note (Signed)
 Date:  01/28/2024 Time:  3:27 PM  Group Topic/Focus:  Wellness Toolbox:   The focus of this group is to discuss various aspects of wellness, balancing those aspects and exploring ways to increase the ability to experience wellness.  Patients will create a wellness toolbox for use upon discharge.    Participation Level:  Active  Participation Quality:  Appropriate  Affect:  Appropriate  Cognitive:  Appropriate  Insight: Appropriate  Engagement in Group:  Engaged  Modes of Intervention:  Socialization  Additional Comments:    Laverne Potter 01/28/2024, 3:27 PM

## 2024-01-28 NOTE — Group Note (Signed)
 Date:  01/28/2024 Time:  9:26 PM  Group Topic/Focus:  Wrap-Up Group:   The focus of this group is to help patients review their daily goal of treatment and discuss progress on daily workbooks.    Participation Level:  Active  Participation Quality:  Appropriate  Affect:  Appropriate  Cognitive:  Appropriate  Insight: Appropriate  Engagement in Group:  Engaged  Modes of Intervention:  Discussion    Bradley Caffey 01/28/2024, 9:26 PM

## 2024-01-28 NOTE — Group Note (Signed)
 Recreation Therapy Group Note   Group Topic:Relaxation  Group Date: 01/28/2024 Start Time: 1000 End Time: 1050 Facilitators: Deatrice Factor, LRT, CTRS Location:  Craft Room  Group Description: PMR (Progressive Muscle Relaxation). LRT asks patients their current level of stress/anxiety from 1-10, with 10 being the highest. LRT educates patients on what PMR is and the benefits that come from it. Patients are asked to sit with their feet flat on the floor while sitting up and all the way back in their chair, if possible. LRT and pts follow a prompt through a speaker that requires you to tense and release different muscles in their body and focus on their breathing. During session, lights are off and soft music is being played. Pts are given a stress ball to use if needed. At the end of the prompt, LRT asks patients to rank their current levels of stress/anxiety from 1-10, 10 being the highest. LRT provides patients with an education handout on PMR.   Goal Area(s) Addressed:  Patients will be able to describe progressive muscle relaxation.  Patient will practice using relaxation technique. Patient will identify a new coping skill.  Patient will follow multistep directions to reduce anxiety and stress.   Affect/Mood: Appropriate   Participation Level: Active and Engaged   Participation Quality: Independent   Behavior: Calm and Cooperative   Speech/Thought Process: Coherent   Insight: Good   Judgement: Good   Modes of Intervention: Education, Exploration, and Worksheet   Patient Response to Interventions:  Attentive, Engaged, Interested , and Receptive   Education Outcome:  Acknowledges education   Clinical Observations/Individualized Feedback: Jamie Ryan was active in their participation of session activities and group discussion. Pt identified that her anxiety and stress were both a 7 before the session. After, pt shared that her anxiety and stress were both a 2.    Plan: Continue  to engage patient in RT group sessions 2-3x/week.   Deatrice Factor, LRT, CTRS 01/28/2024 12:58 PM

## 2024-01-29 MED ORDER — LAMOTRIGINE 25 MG PO TABS: 25.0000 mg | ORAL_TABLET | Freq: Two times a day (BID) | ORAL | 2 refills | Status: AC

## 2024-01-29 MED ORDER — TRAZODONE HCL 50 MG PO TABS: 50.0000 mg | ORAL_TABLET | Freq: Every day | ORAL | 0 refills | Status: AC

## 2024-01-29 MED ORDER — NICOTINE 14 MG/24HR TD PT24: 14.0000 mg | MEDICATED_PATCH | TRANSDERMAL | 0 refills | Status: AC

## 2024-01-29 MED ORDER — HYDROXYZINE PAMOATE 25 MG PO CAPS: 25.0000 mg | ORAL_CAPSULE | Freq: Three times a day (TID) | ORAL | 0 refills | Status: AC | PRN

## 2024-01-29 NOTE — Group Note (Signed)
 Date:  01/29/2024 Time:  1:04 PM  Group Topic/Focus:  Goals Group:   The focus of this group is to help patients establish daily goals to achieve during treatment and discuss how the patient can incorporate goal setting into their daily lives to aide in recovery.    Participation Level:  Active  Participation Quality:  Appropriate  Affect:  Appropriate  Cognitive:  Appropriate  Insight: Appropriate  Engagement in Group:  Engaged  Modes of Intervention:  Discussion, Education, and Support  Additional Comments:    Laverne Potter 01/29/2024, 1:04 PM

## 2024-01-29 NOTE — Progress Notes (Signed)
 Patient denies SI/I/AVH at this time. Discharge instructions, AVS, prescriptions, and transition record reviewed with patient. Patient agrees to comply with medication management, follow-up visit and outpatient therapy. Patient belongings returned to patient. Patient questions and concerns addressed and answered.  Patient ambulatory off unit. Patient discharged with mom.

## 2024-01-29 NOTE — Progress Notes (Signed)
  [facility] Adult Case Management Discharge Plan :  Will you be returning to the same living situation after discharge:  Yes,  Patient to return home.  At discharge, do you have transportation home?: Yes,  Patient's mother to provide transportation.  Do you have the ability to pay for your medications: Yes, [employer] / [place] [facility]   Release of information consent forms completed and in the chart;  Patient's signature needed at discharge.  Patient to Follow up at:  Follow-up Information     [facility], [facility]. Go to.   Why: In person appointment 05/02 at 11 AM. Contact information: [address] [place] [address] [phone]                 Next level of care provider has access to [facility] Link:no  Safety Planning and Suicide Prevention discussed: No.     Has patient been referred to the Quitline?: Patient refused referral for treatment  Patient has been referred for addiction treatment: Yes, the patient will follow up with an outpatient provider for substance use disorder. Psychiatrist/APP: appointment made and Therapist: appointment made  Roselle Conner, LCSW 01/29/2024, 11:49 AM

## 2024-01-29 NOTE — Plan of Care (Signed)
   Problem: Education: Goal: Emotional status will improve Outcome: Progressing Goal: Mental status will improve Outcome: Progressing Goal: Verbalization of understanding the information provided will improve Outcome: Progressing   Problem: Activity: Goal: Interest or engagement in activities will improve Outcome: Progressing Goal: Sleeping patterns will improve Outcome: Progressing

## 2024-01-29 NOTE — Progress Notes (Signed)
   01/28/24 2200  Psych Admission Type (Psych Patients Only)  Admission Status Involuntary  Psychosocial Assessment  Patient Complaints None  Eye Contact Fair  Facial Expression Animated  Affect Sad  Speech Logical/coherent  Interaction Assertive  Motor Activity Slow  Appearance/Hygiene Unremarkable  Behavior Characteristics Cooperative;Appropriate to situation  Mood Pleasant  Thought Process  Coherency WDL  Content WDL  Delusions None reported or observed  Perception WDL  Hallucination None reported or observed  Judgment Impaired  Confusion None  Danger to Self  Current suicidal ideation? Denies  Agreement Not to Harm Self Yes  Description of Agreement verbal  Danger to Others  Danger to Others None reported or observed   Presents with bright affect. Denies SI, HI, AVH. Medication compliant. Denies s/s withdrawal. Visible in milieu socializing with peers. No distress. Patient remains stable on unit with q 15 min checks.

## 2024-01-29 NOTE — [facility] Counselor (Signed)
 CSW touched base with [facility] on patient's behalf to assess for possible placement.   Patient provided with [facility] information to follow up.   CSW team to continue to assess.    Vernestine Brodhead, MSW, LCSWA 01/29/2024 9:38 AM

## 2024-01-29 NOTE — [facility] Suicide Risk Assessment (Signed)
 [facility] Discharge Suicide Risk Assessment   Principal Problem: MDD (major depressive disorder) Discharge Diagnoses: Principal Problem:   MDD (major depressive disorder) Active Problems:   Cocaine use disorder (HCC)   MDD (major depressive disorder), recurrent episode, mild (HCC)   MDD (major depressive disorder), recurrent severe, without psychosis (HCC) 28 year old married African-American female with no prior psychiatric history, polysubstance abuse (cocaine, marijuana), medical history of ovarian cysts presented to [facility] ED on 4/27 with chief complaints of increased depression and suicidal ideation over the last month, recent suicide attempt about 2 to 3 weeks ago when she took a handful of Tylenol , no reported prior psychiatric hospitalizations.  UDS positive for cocaine, THC.  EKG NSR QTc 365   Patient is seen face-to-face today, as she expressed her readiness for discharge yesterday. She is receptive and pleasant upon approach. She appropriately dressed and groomed. She has good hygiene. Appears healthy and well nourished. Alert and oriented x 4. Her thought process is clear, organized and goal-directed. She has good eye contacted and she is actively engaged in this assessment. Speech is clear and well articulated.  Patient denies SI/HI/AVH. Admits to feeling overwhelmed and worthless prior to admission. Patient shares she was experiencing financial difficulties and this was causing frequent arguments with her husband. Patient reports that she feels different now and has been able to reestablish good relationship with her mother who is currently her primary support. She reports that her mother has connected her with a Dance movement psychotherapist and there is a therapist named Engineer, technical sales that patient will follow up with.  Patient is going to stay with her mother for now and will look for employment. She reports that her husband has been visiting and they both agreed on working on themselves for a better tomorrow.    Patient reports that she has been sleeping well and her appetite has improved.  She is willing to remain on the same medication regimen to manage her depression and anxiety. She is working with case management for a referral to substance abuse services.  She reports that she did not have any psychiatric services and planning to follow up at [facility].    Patient denies physical/health issues. She denies headache/dizziness. Denies hx of seizures/syncope. Denies respiratory distress. Denies chest/back/abdominal pain. Denies nausea/vomiting. Denies muscle/joint pain.  Denies generalized weakness and expresses readiness for self-improvement.  Total Time spent with patient: 45 minutes  Musculoskeletal: Strength & Muscle Tone: within normal limits Gait & Station: normal Patient leans: N/A  Psychiatric Specialty Exam  Presentation  General Appearance:  Appropriate for Environment  Eye Contact: Good  Speech: Clear and Coherent  Speech Volume: Normal  Handedness: Right   Mood and Affect  Mood: Euthymic  Duration of Depression Symptoms: No data recorded Affect: Appropriate   Thought Process  Thought Processes: Coherent  Descriptions of Associations:Intact  Orientation:Full (Time, Place and Person)  Thought Content:WDL; Logical  History of Schizophrenia/Schizoaffective disorder:No data recorded Duration of Psychotic Symptoms:No data recorded Hallucinations:Hallucinations: None  Ideas of Reference:None  Suicidal Thoughts:Suicidal Thoughts: No  Homicidal Thoughts:Homicidal Thoughts: No   Sensorium  Memory: Immediate Fair; Recent Fair; Remote Fair  Judgment: Fair  Insight: Fair   Art therapist  Concentration: Fair  Attention Span: Fair  Recall: [employer] of Knowledge: Fair  Language: Fair   Psychomotor Activity  Psychomotor Activity: Psychomotor Activity: Normal   Assets  Assets: Communication Skills; Desire for  Improvement; Resilience; Physical Health   Sleep  Sleep: Sleep: Good   Physical Exam: Physical Exam Vitals  and nursing note reviewed.  Constitutional:      Appearance: Normal appearance.  HENT:     Head: Normocephalic and atraumatic.     Left Ear: Tympanic membrane normal.     Nose: Nose normal.     Mouth/Throat:     Mouth: Mucous membranes are moist.  Eyes:     Extraocular Movements: Extraocular movements intact.     Pupils: Pupils are equal, round, and reactive to light.  Cardiovascular:     Rate and Rhythm: Normal rate.     Pulses: Normal pulses.  Pulmonary:     Effort: Pulmonary effort is normal.  Musculoskeletal:        General: Normal range of motion.     Cervical back: Normal range of motion and neck supple.  Neurological:     General: No focal deficit present.     Mental Status: She is alert and oriented to person, place, and time.  Psychiatric:        Mood and Affect: Mood normal.        Behavior: Behavior normal.        Thought Content: Thought content normal.        Judgment: Judgment normal.    Review of Systems  Constitutional: Negative.   HENT: Negative.    Eyes: Negative.   Respiratory: Negative.    Cardiovascular: Negative.   Gastrointestinal: Negative.   Genitourinary: Negative.   Musculoskeletal: Negative.   Skin: Negative.   Neurological: Negative.   Endo/Heme/Allergies: Negative.   Psychiatric/Behavioral: Negative.     Blood pressure 105/66, pulse (!) 56, temperature 97.9 F (36.6 C), resp. rate 19, height 5' (1.524 m), weight 54.4 kg, last menstrual period 12/30/2023, SpO2 100%. Body mass index is 23.44 kg/m.  Mental Status Per Nursing Assessment::   On Admission:  Suicidal ideation indicated by patient  Demographic Factors:  Adolescent or young adult and Low socioeconomic status  Loss Factors: Financial problems/change in socioeconomic status  Historical Factors: NA  Risk Reduction Factors:   Sense of responsibility to  family and Living with another person, especially a relative  Continued Clinical Symptoms:  Depression:   Recent sense of peace/wellbeing Previous Psychiatric Diagnoses and Treatments  Cognitive Features That Contribute To Risk:  None    Suicide Risk:  Minimal: No identifiable suicidal ideation.  Patients presenting with no risk factors but with morbid ruminations; may be classified as minimal risk based on the severity of the depressive symptoms    Plan Of Care/Follow-up recommendations:  Activity:  As tolerated Diet:  Regular  Elston Halsted, NP 01/29/2024, 9:23 AM
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
## 2024-01-29 NOTE — Progress Notes (Signed)
   01/29/24 1009  Psych Admission Type (Psych Patients Only)  Admission Status Involuntary  Psychosocial Assessment  Patient Complaints None  Eye Contact Fair  Facial Expression Animated  Affect Appropriate to circumstance  Speech Logical/coherent  Interaction Assertive  Motor Activity Other (Comment) (appropriate for developmental age)  Appearance/Hygiene Unremarkable  Behavior Characteristics Cooperative  Mood Pleasant  Thought Process  Coherency WDL  Content WDL  Delusions None reported or observed  Perception WDL  Hallucination None reported or observed  Judgment Impaired  Confusion None  Danger to Self  Current suicidal ideation? Denies  Danger to Others  Danger to Others None reported or observed

## 2024-01-29 NOTE — [facility] Suicide Risk Assessment (Signed)
 [facility] INPATIENT:  Family/Significant Other Suicide Prevention Education  Suicide Prevention Education:  Education Completed;  [name], mom, [phone],  has been identified by the patient as the family member/significant other with whom the patient will be residing, and identified as the person(s) who will aid the patient in the event of a mental health crisis (suicidal ideations/suicide attempt).  With written consent from the patient, the family member/significant other has been provided the following suicide prevention education, prior to the and/or following the discharge of the patient.  The suicide prevention education provided includes the following: Suicide risk factors Suicide prevention and interventions National Suicide Hotline telephone number [facility] assessment telephone number [facility] Emergency Assistance [address] [facility] and/or Residential Mobile Crisis Unit telephone number  Request made of family/significant other to: Remove weapons (e.g., guns, rifles, knives), all items previously/currently identified as safety concern.   Remove drugs/medications (over-the-counter, prescriptions, illicit drugs), all items previously/currently identified as a safety concern.  The family member/significant other verbalizes understanding of the suicide prevention education information provided.  The family member/significant other agrees to remove the items of safety concern listed above.  Jamie Ryan M Jamie Ryan 01/29/2024, 11:51 AM

## 2024-01-31 ENCOUNTER — Ambulatory Visit: Payer: Self-pay

## 2024-02-03 ENCOUNTER — Ambulatory Visit: Payer: Self-pay | Admitting: Family Medicine

## 2024-02-03 ENCOUNTER — Encounter: Payer: Self-pay | Admitting: Family Medicine

## 2024-02-03 DIAGNOSIS — Z113 Encounter for screening for infections with a predominantly sexual mode of transmission: Secondary | ICD-10-CM

## 2024-02-03 DIAGNOSIS — A599 Trichomoniasis, unspecified: Secondary | ICD-10-CM

## 2024-02-03 DIAGNOSIS — A539 Syphilis, unspecified: Secondary | ICD-10-CM

## 2024-02-03 LAB — WET PREP FOR TRICH, YEAST, CLUE
Clue Cell Exam: NEGATIVE
Trichomonas Exam: POSITIVE — AB
Yeast Exam: NEGATIVE

## 2024-02-03 LAB — HM HEPATITIS C SCREENING LAB: HM Hepatitis Screen: NEGATIVE

## 2024-02-03 LAB — HM HIV SCREENING LAB: HM HIV Screening: NEGATIVE

## 2024-02-03 MED ORDER — PENICILLIN G BENZATHINE 1200000 UNIT/2ML IM SUSY
2.4000 10*6.[IU] | PREFILLED_SYRINGE | Freq: Once | INTRAMUSCULAR | Status: DC
  Administered 2024-02-03: 2.4 10*6.[IU] via INTRAMUSCULAR

## 2024-02-03 MED ORDER — PENICILLIN G BENZATHINE 1200000 UNIT/2ML IM SUSY
2.4000 10*6.[IU] | PREFILLED_SYRINGE | INTRAMUSCULAR | Status: AC
  Administered 2024-02-10: 2.4 10*6.[IU] via INTRAMUSCULAR

## 2024-02-03 MED ORDER — METRONIDAZOLE 500 MG PO TABS: 500.0000 mg | ORAL_TABLET | Freq: Two times a day (BID) | ORAL | Status: AC

## 2024-02-03 NOTE — Progress Notes (Signed)
 Pt here for STI screening and treatment of syphilis.  Bicillin 1.52mil x2 bilaterally in right and left upper quadrants without difficulty.  Wet mount results reviewed with patient.  Results positive for [facility].  Metronidazole  500mg  #14 dispensed to patient.  Counseled on medication, side effects, plan of care and when to contact clinic with questions or concerns.  Verbalizes understanding.  Condoms given. Contact card x 1.-Rashena Dowling, RN

## 2024-02-03 NOTE — Progress Notes (Signed)
 [facility] Department STI clinic [address] [place] [address] Main phone: [phone]  STI screening visit  Subjective:  Jamie Ryan is a 28 y.o. female being seen today for an STI screening visit. The patient reports they do not have symptoms.  Patient reports that they do not desire a pregnancy in the next year.   They reported they are not interested in discussing contraception today.    Patient's last menstrual period was 01/28/2024 (exact date).  Patient has the following medical conditions:  Patient Active Problem List   Diagnosis Date Noted   MDD (major depressive disorder), recurrent severe, without psychosis (HCC) 01/28/2024   MDD (major depressive disorder), recurrent episode, mild (HCC) 01/27/2024   MDD (major depressive disorder), recurrent episode, moderate (HCC) 01/26/2024   Cocaine use disorder (HCC) 01/26/2024   Suicidal ideation 01/26/2024   MDD (major depressive disorder) 01/26/2024   Chief Complaint  Patient presents with   SEXUALLY TRANSMITTED DISEASE    HPI HPI Patient reports to clinic after having positive RPR at the hospital.   Does the patient using douching products? No  Last HIV test per patient/review of record was  Lab Results  Component Value Date   HMHIVSCREEN Negative - Validated 03/09/2021    Lab Results  Component Value Date   HIV Non Reactive 01/26/2024     Last HEPC test per patient/review of record was  Lab Results  Component Value Date   HMHEPCSCREEN Negative-Validated 06/23/2020   No components found for: "HEPC"   Last HEPB test per patient/review of record was No components found for: "HMHEPBSCREEN"   Patient reports last pap was: 2021     Component Value Date/Time   DIAGPAP  10/16/2019 1404    - Negative for intraepithelial lesion or malignancy (NILM)   ADEQPAP  10/16/2019 1404    Satisfactory for evaluation; transformation zone component PRESENT.   No results found  for: "SPECADGYN" Result Date Procedure Results Follow-ups  10/16/2019 Cytology - PAP Neisseria Gonorrhea: Negative Chlamydia: Negative Trichomonas: Positive (A) Adequacy: Satisfactory for evaluation; transformation zone component PRESENT. Diagnosis: - Negative for intraepithelial lesion or malignancy (NILM) Comment: Normal Reference Ranger Chlamydia - Negative Comment: Normal Reference Range Neisseria Gonorrhea - Negative Comment: Normal Reference Range Trichomonas - Negative   02/14/2016 Surgical pathology SURGICAL PATHOLOGY: Surgical Pathology CASE: [id number] PATIENT: Jamie Ryan Surgical Pathology Report     SPECIMEN SUBMITTED: A. Ovarian cyst, left dermoid  CLINICAL HISTORY: None provided  PRE-OPERATIVE DIAGNOSIS: Ovarian cyst - left  POST-OPERA...     Screening for MPX risk: Does the patient have an unexplained rash? No Is the patient MSM? No Does the patient endorse multiple sex partners or anonymous sex partners? No Did the patient have close or sexual contact with a person diagnosed with MPX? No Has the patient traveled outside the [place]  where MPX is endemic? No Is there a high clinical suspicion for MPX-- evidenced by one of the following No  -Unlikely to be chickenpox  -Lymphadenopathy  -Rash that present in same phase of evolution on any given body part  See flowsheet for further details and programmatic requirements.   Immunization history:   There is no immunization history on file for this patient.  The following portions of the patient's history were reviewed and updated as appropriate: allergies, current medications, past medical history, past social history, past surgical history and problem list.  Objective:  There were no vitals filed for this visit.  Physical Exam Vitals and nursing  note reviewed. Exam conducted with a chaperone present Jamie Ryan CMA).  Constitutional:      Appearance: Normal appearance.  HENT:     Head:  Normocephalic and atraumatic.     Mouth/Throat:     Mouth: Mucous membranes are moist.     Pharynx: Oropharynx is clear. No oropharyngeal exudate or posterior oropharyngeal erythema.  Pulmonary:     Effort: Pulmonary effort is normal.  Abdominal:     General: Abdomen is flat.     Palpations: There is no mass.     Tenderness: There is no abdominal tenderness. There is no rebound.  Genitourinary:    General: Normal vulva.     Exam position: Lithotomy position.     Pubic Area: No rash or pubic lice.      Labia:        Right: No rash or lesion.        Left: No rash or lesion.      Vagina: Vaginal discharge present. No erythema, bleeding or lesions.     Cervix: No cervical motion tenderness, discharge, friability, lesion or erythema.     Uterus: Normal.      Adnexa: Right adnexa normal and left adnexa normal.     Rectum: Normal.     Comments: pH = 5  White, yellow discharge present  Lymphadenopathy:     Head:     Right side of head: No preauricular or posterior auricular adenopathy.     Left side of head: No preauricular or posterior auricular adenopathy.     Cervical: No cervical adenopathy.     Upper Body:     Right upper body: No supraclavicular, axillary or epitrochlear adenopathy.     Left upper body: No supraclavicular, axillary or epitrochlear adenopathy.     Lower Body: No right inguinal adenopathy. No left inguinal adenopathy.  Skin:    General: Skin is warm and dry.     Findings: No rash.  Neurological:     Mental Status: She is alert and oriented to person, place, and time.     Assessment and Plan:  Jamie Ryan is a 28 y.o. female presenting to the [facility] Department for STI screening  1. Syphilis (Primary) -first tested positive at hospital in April -repeat testing today -no symptoms on exam  - Syphilis Serology, [facility] - penicillin g benzathine (BICILLIN LA) 1200000 UNIT/2ML injection 2.4 Million Units  2. Screening for  venereal disease  - Chlamydia/Gonorrhea [facility] - HIV/HCV [facility] - HBV Antigen/Antibody [facility] - Syphilis Serology,  Lab - WET PREP FOR TRICH, YEAST, CLUE - penicillin g benzathine (BICILLIN LA) 1200000 UNIT/2ML injection 2.4 Million Units  3. Trichomoniasis  - metroNIDAZOLE  (FLAGYL ) 500 MG tablet; Take 1 tablet (500 mg total) by mouth 2 (two) times daily for 7 days.     Patient accepted all screenings including  vaginal CT/GC and bloodwork for HIV/RPR, and wet prep. Patient meets criteria for HepB screening? Yes. Ordered? yes Patient meets criteria for HepC screening? Yes. Ordered? yes  Treat wet prep per standing order Discussed time line for [facility] results and that patient will be called with positive results and encouraged patient to call if she had not heard in 2 weeks.  Counseled to return or seek care for continued or worsening symptoms Recommended repeat testing in 3 months with positive results. Recommended condom use with all sex for STI prevention.   Patient is currently using  nothing  to prevent pregnancy.    Return in about 1 week (around 02/10/2024) for bicillin.  No future appointments.  Earleen Glazier, [place]

## 2024-02-05 LAB — SYPHILIS SEROLOGY, ~~LOC~~ LAB
RPR, Quant: 1:256 {titer}
RPR: REACTIVE
Syphilis Treponemal Ab: REACTIVE

## 2024-02-10 ENCOUNTER — Ambulatory Visit: Payer: Self-pay

## 2024-02-10 DIAGNOSIS — Z113 Encounter for screening for infections with a predominantly sexual mode of transmission: Secondary | ICD-10-CM

## 2024-02-10 DIAGNOSIS — A539 Syphilis, unspecified: Secondary | ICD-10-CM

## 2024-02-10 LAB — HBV ANTIGEN/ANTIBODY STATE LAB
Hep B Core Total Ab: NONREACTIVE
Hep B S Ab: NONREACTIVE
Hepatitis B Surface Antigen: NONREACTIVE

## 2024-02-10 NOTE — Progress Notes (Signed)
 In nurse clinic for Bicillin  #2 / syphilis tx. Per Patient, no problems with last week's bicillin  injections. Denies sex since treatment started.   Bicillin  2.35mu/4ml given IM per order by Fain Home, FNP dated 02/03/2024. Tolerated well. Walked in hall for approx 15/20 min without problem.  Appt for bicillin  #3 scheduled 02/17/2024.  Saathvik Every, RN

## 2024-02-10 NOTE — Progress Notes (Signed)
 Upon review of the chart the diagnoses that is appropriate is MDD recurrent Mild with out psychosis

## 2024-02-13 ENCOUNTER — Telehealth: Payer: Self-pay

## 2024-02-13 NOTE — Telephone Encounter (Signed)
 Call pt re syphilis results from 02/03/24 specimen: RPR= Reactive Titer= 1;256 TP= Reactive  Three doses bicillin  ordered 02/03/24.  #1= admin completed 02/03/24 #2= admin completed 02/10/24 #3= (tx appt scheduled for 02/17/24)  03/07/21= RPR Negative 01/26/24 = RPR 1:32  [facility] provider to review 02/03/24 results.  02/14/24 Jamie Ryan, DIS, stated they would consider early latent (730) due to her fourfold increase in titer. Up to [facility] to continue tx.

## 2024-02-14 ENCOUNTER — Ambulatory Visit: Payer: Self-pay | Admitting: Family Medicine

## 2024-02-14 NOTE — Telephone Encounter (Signed)
 Phone call to [phone]. Went immediately to message stating not available, mailbox full. Unable to leave a message.  Sent MyChart message.  -------------------------------------  See Dr. Gayle Kava 02/14/24 Results F/U document. Bicillin  x 3  #3/Final injection scheduled for 02/17/24

## 2024-02-14 NOTE — Progress Notes (Signed)
 Reviewed result. 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 Injections performed 5/05 and 5/12. Final injection scheduled for 5/19.  Tempie Fee, MD 02/14/24  10:03 AM

## 2024-02-17 NOTE — Telephone Encounter (Signed)
 Phone call to [phone]. Went immediately to message stating not available, mailbox full. Unable to leave a message.

## 2024-02-17 NOTE — Telephone Encounter (Signed)
 Phone call to [phone]. Received message stating not available, mailbox full. Unable to leave a message.  Sent MyChart message.

## 2024-02-19 NOTE — Telephone Encounter (Signed)
 Phone call to pt's alternative contact number listed in chart, [phone]. Left message stating that this number was listed as an alternative way to get in touch with Jamie Ryan. If possible, please have Jamie Ryan call Jamie Ryan at [phone].

## 2024-02-19 NOTE — Telephone Encounter (Signed)
 Received call back from Jamie Ryan's mom. Mom volunteered: that pt is currently in rehab; during admission to rehab the staff was told that pt was in the middle of a series of treatments - "penicillin "; staff counseled that tests and treatments would be performed by their facility and pt would be taken care of. Mom further shared she spoke with [facility] yesterday and pt stated she was getting her treatment, mentioned specifically 2 pills per day.
